# Patient Record
Sex: Female | Born: 1965
Health system: Southern US, Community
[De-identification: ages and names within clinical notes are randomized; demographics above are authoritative.]

## PROBLEM LIST (undated history)

## (undated) DIAGNOSIS — K219 Gastro-esophageal reflux disease without esophagitis: Secondary | ICD-10-CM

## (undated) DIAGNOSIS — T4145XA Adverse effect of unspecified anesthetic, initial encounter: Secondary | ICD-10-CM

## (undated) DIAGNOSIS — J45909 Unspecified asthma, uncomplicated: Secondary | ICD-10-CM

## (undated) DIAGNOSIS — T8859XA Other complications of anesthesia, initial encounter: Secondary | ICD-10-CM

## (undated) DIAGNOSIS — Z9889 Other specified postprocedural states: Secondary | ICD-10-CM

## (undated) DIAGNOSIS — R112 Nausea with vomiting, unspecified: Secondary | ICD-10-CM

## (undated) DIAGNOSIS — K8020 Calculus of gallbladder without cholecystitis without obstruction: Secondary | ICD-10-CM

## (undated) HISTORY — PX: EYE SURGERY: SHX253

## (undated) HISTORY — PX: BREAST BIOPSY: SHX20

## (undated) HISTORY — PX: KNEE SURGERY: SHX244

## (undated) HISTORY — PX: ABDOMINAL HYSTERECTOMY: SHX81

---

## 1997-12-02 ENCOUNTER — Other Ambulatory Visit: Admission: RE | Admit: 1997-12-02 | Discharge: 1997-12-02 | Payer: Self-pay | Admitting: Obstetrics and Gynecology

## 2001-08-01 ENCOUNTER — Ambulatory Visit (HOSPITAL_COMMUNITY): Admission: EM | Admit: 2001-08-01 | Discharge: 2001-08-01 | Payer: Self-pay | Admitting: Emergency Medicine

## 2001-08-03 ENCOUNTER — Inpatient Hospital Stay (HOSPITAL_COMMUNITY): Admission: RE | Admit: 2001-08-03 | Discharge: 2001-08-05 | Payer: Self-pay | Admitting: Specialist

## 2003-01-02 ENCOUNTER — Emergency Department (HOSPITAL_COMMUNITY): Admission: EM | Admit: 2003-01-02 | Discharge: 2003-01-03 | Payer: Self-pay

## 2007-05-15 ENCOUNTER — Encounter (INDEPENDENT_AMBULATORY_CARE_PROVIDER_SITE_OTHER): Payer: Self-pay | Admitting: Obstetrics and Gynecology

## 2007-05-15 ENCOUNTER — Ambulatory Visit (HOSPITAL_COMMUNITY): Admission: RE | Admit: 2007-05-15 | Discharge: 2007-05-16 | Payer: Self-pay | Admitting: Obstetrics and Gynecology

## 2010-11-23 ENCOUNTER — Other Ambulatory Visit: Payer: Self-pay | Admitting: Family Medicine

## 2010-11-23 DIAGNOSIS — R928 Other abnormal and inconclusive findings on diagnostic imaging of breast: Secondary | ICD-10-CM

## 2010-11-23 DIAGNOSIS — N6459 Other signs and symptoms in breast: Secondary | ICD-10-CM

## 2010-11-25 ENCOUNTER — Other Ambulatory Visit: Payer: Self-pay | Admitting: *Deleted

## 2010-11-25 DIAGNOSIS — N6459 Other signs and symptoms in breast: Secondary | ICD-10-CM

## 2010-11-30 ENCOUNTER — Ambulatory Visit
Admission: RE | Admit: 2010-11-30 | Discharge: 2010-11-30 | Disposition: A | Discharge status: Home / Self Care | Payer: BC Managed Care – PPO | Source: Ambulatory Visit | Attending: Family Medicine | Admitting: Family Medicine

## 2010-11-30 DIAGNOSIS — R928 Other abnormal and inconclusive findings on diagnostic imaging of breast: Secondary | ICD-10-CM

## 2010-12-20 NOTE — Discharge Summary (Signed)
Madeline Ramos, Madeline Ramos               ACCOUNT NO.:  [id number]   MEDICAL RECORD NO.:  [id number]          PATIENT TYPE:  OIB   LOCATION:  9307                          FACILITY:  [facility]   PHYSICIAN:  Sherron Monday, MD        DATE OF BIRTH:  13-Aug-1965   DATE OF ADMISSION:  05/15/2007  DATE OF DISCHARGE:  05/16/2007                               DISCHARGE SUMMARY   PROCEDURES:  Total vaginal hysterectomy, posterior repair, TVT SECUR,  cystoscopy.   ADMISSION DIAGNOSIS:  Pelvic relaxation, stress urinary incontinence.   HISTORY OF PRESENT ILLNESS:  Patient is a 45 year old G3, P1-0-2-1, with  complaints of pelvic relaxation and stress urinary incontinence, proven  by cystometric study.   PAST MEDICAL HISTORY:  Significant for asthma.   PAST SURGICAL HISTORY:  1. 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  2. In 1994, total knee replacement, left knee, with a bone graft.  3. In 2000, repeat total knee.  Dilatation and curettage.  4. Total knee revision, 2008.   ALLERGIES:  No known drug allergies.   MEDICATIONS:  Albuterol, Advair, Prilosec.   SOCIAL HISTORY:  Divorced.  Works in a Nurse, learning disability.  No history of  tobacco use.  Occasional alcohol use.  No other drug use.   PAST OB-GYN HISTORY:  G1 was a term vaginal delivery.  G2 was a  miscarriage with a D and C.  G3 was a therapeutic abortion.  No history  of any abnormal Pap smears.  No history of any sexually-transmitted  diseases.   PHYSICAL EXAM:  Per the dictated H and P.  Endometrial biopsy performed  and workup was within normal limits.  Cervical polyps were also removed.   Prior to admission, we discussed with the patient risks, benefits, and  alternatives to the surgical procedure.  She wished to proceed.  She  underwent the surgery on May 15, 2007, without difficulty and a 200  mL blood-loss.  Her postoperative course was uncomplicated.  She  remained afebrile with stable vital signs and was ambulating well.   Her  hemoglobin decreased from 10.3 to 7.9, but she was able to ambulate,  tolerate a diet and her pain was well-  controlled.  Her creatinine was stable and she was discharged to home  with routine discharge instructions and numbers to call if any questions  or problems, as well as prescriptions for Motrin and Vicodin.  She  voices understanding of all this and will call if she has any questions  or problems.      Sherron Monday, MD  Electronically Signed     JB/MEDQ  D:  05/16/2007  T:  05/17/2007  Job:  [id number]

## 2010-12-20 NOTE — Op Note (Signed)
NAMECHENITA, Madeline Ramos               ACCOUNT NO.:  [id number]   MEDICAL RECORD NO.:  [id number]          PATIENT TYPE:  AMB   LOCATION:  SDC                           [facility]ACILITY:  [facility]   PHYSICIAN:  Sherron Monday, MD        DATE OF BIRTH:  July 29, 1966   DATE OF PROCEDURE:  05/15/2007  DATE OF DISCHARGE:                               OPERATIVE REPORT   PREOPERATIVE DIAGNOSIS:  Pelvic relaxation, stress urinary incontinence.   POSTOPERATIVE DIAGNOSIS:  Pelvic relaxation, stress urinary  incontinence.   PROCEDURE:  Total vaginal hysterectomy, posterior repair, TVT suture,  cystoscopy.   SURGEON:  Sherron Monday, M.D.   ASSISTANT:  Zenaida Niece, M.D.   ANESTHESIA:  General with local.   FINDINGS:  184-gram uterus, normal ovaries bilaterally, 2+ rectocele,  stress urinary incontinence on cystometrics in the office.   SPECIMENS:  Uterus and cervix to pathology.   ESTIMATED BLOOD LOSS:  200 mL.   INTRAVENOUS FLUIDS:  2500 mL.   URINE OUTPUT:  I&O catheterization at the start of the procedure.   COMPLICATIONS:  None.   DISPOSITION:  Stable to PACU after the procedure.   PROCEDURE:  After informed consent including risks, benefits and  alternatives of the surgical procedure including but not limited to  bleeding, infection, damage to surrounding organs including but not  limited to bowel, bladder, ureters and blood vessels as well as urinary  retention, continued problems with incontinence and continued pelvic  pressure or pain, the patient was transported to the operating room,  placed on the table in supine position.  General anesthesia was induced  and found be adequate.  She was then placed in the yellow fin stirrups,  prepped and draped in the normal sterile fashion.  Her bladder was  sterilely drained using a heavy weighted speculum and a Sims retractor.  Her cervix was easily visualized and grasped with Christella Hartigan tenaculum.  It  was infiltrated with approximately 15 mL of  Pitressin, and then the  cervix was circumscribed with the Bovie cautery.  An attempt was made to  enter the peritoneal cavity anteriorly.  This was unable to be  accomplished, so entry posteriorly was performed.  A single stitch of 0  Vicryl was placed in the middle of this entry to control bleeding.  The  uterosacral ligaments were ligated on both sides.  The stitches were  held in a stepwise fashion.  The uterus and cervix were excised.  Pedicles were all found to be hemostatic.  The uterus and cervix were  removed, and the pedicles were again inspected and found to be  hemostatic.  There was bleeding noted at the left ovary.  This was made  hemostatic with a 3-0 Vicryl.  The uterosacral ligaments were plicated,  and the held sutures were tied together.  The anterior portion was  inspected, and minimal cystocele was noted.  The decision was made not  to perform the anterior repair.  The cuff was closed with 2-0 Vicryl in  a running locked fashion.  The posterior cul-de-sac was inspected.  The  rectocele was  illustrated, and the mucosa was infiltrated with Marcaine,  and using Allis clamps, the mucosa was incised and the fascia was  dissected out and closed in second layer of 2-0 Vicryl.  The mucosa was  then trimmed.  The mucosa was closed with 2-0 Vicryl in a running locked  fashion.  Attention was then turned to the TVT secure which was  performed in the typical fashion.  Approximately a centimeter and half  below the urethral meatus, a vertical incision was made after  infiltration with Marcaine and lidocaine, and the tract of the TVT  secure was dissected using Metzenbaum scissors.  A knife handle was used  to assure that there was enough room for the placement of the device.  The device was then placed.  The cystoscopy was performed revealing jets  from both ureters as well as the urethral opening with no problems or  interference by the device.  The placement of the mesh was  tested and  found to be snug but not overly tight.  The device was then deployed and  the introducers were removed.  This mucosa was closed with 2-0 Vicryl in  a running locked fashion.  Sponge, lap and needle counts were correct x2  at the end of the procedure.  The patient tolerated the procedure well.  Her Foley catheter was sterilely placed, and the vagina was packed with  Estrace soaked gauze.      Sherron Monday, MD  Electronically Signed     JB/MEDQ  D:  05/15/2007  T:  05/15/2007  Job:  [id number]

## 2010-12-20 NOTE — H&P (Signed)
Madeline Ramos, Madeline Ramos               ACCOUNT NO.:  [id number]   MEDICAL RECORD NO.:  [id number]          PATIENT TYPE:  AMB   LOCATION:  SDC                           [facility]   PHYSICIAN:  Sherron Monday, MD        DATE OF BIRTH:  [birth year]   DATE OF ADMISSION:  05/15/2007  DATE OF DISCHARGE:                              HISTORY & PHYSICAL   PLANNED PROCEDURES:  1. Total vagina hysterectomy.  2. Anterior and posterior repairs.  3. TVT SECUR.  4. Cystoscopy.   DATE OF PROCEDURE:  May 15, 2007.   ADMISSION DIAGNOSES:  1. Pelvic relaxation.  2. Stress urinary incontinence.   HISTORY OF PRESENT ILLNESS:  A 45 year old G3, P1-0-2-1, who presents  with complaints of prolapsing bladder and worsening symptoms and stress  urinary incontinence.   PAST MEDICAL HISTORY:  Significant for asthma.   PAST SURGICAL HISTORY:  Significant for:  1. 1989 giant cell tumor surgery in her femur.  2. 1994 total knee replacement, left knee.  3. 1994, total knee/ bone graft.  4. 2000, total knee.  5. D&C.  6. 2008, total knee revision.   ALLERGIES:  No known drug allergies.   MEDICATIONS:  Albuterol, Advair, Prilosec.   SOCIAL HISTORY:  She is divorced.  Works at a Scientist, product/process development.  No  history of tobacco use, rare alcohol use.  No other drug use.   GYN HISTORY:  She has no history of abnormal Pap smears.  Her last was  in August of this year.  At that time, she also had an endometrial  biopsy that was benign.  As well, a polyp was removed from her cervix  which was also benign.  She has no history of any sexually transmitted  diseases.  Menarche was at age 61 with regular cycles, and they last  approximately 4-6 days.  She has moderate flow and some pain.  She is  currently not sexually active.  In the past has been monogamous with  female partners.  No contraceptives.  On her first visit, she had  intramenstrual bleeding, resolved after the polypectomy, as well as  occasional post  coital bleeding.   OB HISTORY:  1. In April 1994, she had a term delivery of an 8 pound 1 ounce      infant.  2. In the 1980s, she had a miscarriage with a D&C.  3. Therapeutic abortion.   PHYSICAL EXAMINATION:  VITAL SIGNS:  Height 5 feet 5-1/2 inches.  Weight  195 pounds.  Blood pressure 140s/80s.  GENERAL:  No apparent distress.  HEENT:  Mucous membranes are moist.  NECK:  No lymphadenopathy.  Within normal limits.  HEART:  Regular rate and rhythm.  LUNGS:  Clear to auscultation bilaterally.  BACK:  Without costovertebral angle tenderness.  BREASTS:  C cup, no masses, nontender, no discharges.  ABDOMEN:  Soft, nontender, nondistended.  Good bowel sounds.  EXTREMITIES:  Symmetric and nontender.  PELVIC:  Normal external female genitalia.  Normal Bartholin, urethral,  Skene.  Poor support is noted.  Multiple polyps which were removed in  her ongoing GYN care.  Cervix has motion tenderness and normal vagina.  Uterus and adnexa within normal limits; however, she did have some  descensus.   She had endometrial biopsy and polypectomy for 3 cervical polyps.  The  endometrial biopsy was as above.   She had a urodynamic study revealing some stress urinary incontinence.   At this time, we discussed with the patient risks, benefits, and  alternatives to surgery including but not limited to infection, damage  to surrounding organs to include but not limited to bowel, bladder,  ureters, blood vessels as well as continued problems with voiding,  continued pressure, and or urinary leakage.  She voices understanding of  all of this and plans to proceed with the surgery on May 15, 2007.   ASSESSMENT AND PLAN:  A 45 year old female with pelvic relaxation total  vaginal hysterectomy, anterior and posterior repairs, and TVT SECUR.  Will proceed with surgery on May 15, 2007.      Sherron Monday, MD  Electronically Signed     JB/MEDQ  D:  05/14/2007  T:  05/14/2007  Job:  [id number]

## 2010-12-23 NOTE — Cardiovascular Report (Signed)
[place]  Patient:    Madeline Ramos, Madeline Ramos Visit Number: [id number] MRN: [id number]          Service Type: OUT Location: ED Attending Physician:  Lubertha South Dictated by:   Kerrin Champagne, M.D. Proc. Date: 08/02/01 Admit Date:  08/01/2001 Discharge Date: 08/01/2001                          Cardiac Catheterization  PREOPERATIVE DIAGNOSIS: Pyarthrosis, left total knee arthroplasty with distal left femoral allograft.  PROCEDURES: Left knee arthroscopic debridement and irrigation with 9 L of irrigant saline solution and 500 cc of double antibiotic solution, placement of drains, left knee.  SURGEON: Kerrin Champagne, M.D.  ASSISTANT: None.  ANESTHESIA: GOT, Dr. Randa Evens.  ESTIMATED BLOOD LOSS: 50 cc.  DRAINS: Two large Hemovac drains, left knee.  TOURNIQUET TIME: 300 mmHg 30 minutes.  BRIEF CLINICAL HISTORY: The patient is a 45 year old female. She has had a three-day history of progressive increasing left knee pain infusion. She was seen in the office yesterday, underwent aspiration of the knee, found to have yellow, cloudy, synovial fluid. White cell count was found to be 44.5 thousand with the differential of 85 neutrophils and 10 monocytes.  Sedimentation rate is mildly elevated at 51. Her white cell count on the CBC was 9.9 thousand with a normal shift. A Grams stain of synovial fluid showed no organism seen, few white cells. Culture reports are still not back and are pending. The patients synovial fluid glucose is 45. Her protein was 4.1. Initial discussion indicated rather borderline test. The patient was sent home last evening. Today with discussion with her total knee specialist, it was felt that irrigation and debridement was appropriate treatment, and this appears to be an early pyarthrosis of the left knee.  She is brought to the operating room today to undergo arthroscopic debridement and irrigation.  DESCRIPTION OF  PROCEDURE: After adequate general anesthesia, the left lower extremity was prepped with DuraPrep solution for the left ankle, left upper thigh.  A knee holder was used and tourniquet about the left upper thigh. Draped in the usual manner. The incisions were portals, two were made in the anterior medial parapatellar incision and used for a total knee arthroplasty to prevent later scar in this area. The superior medial portal used for placement of the inflow cannula expressing the synovial fluid and obtaining cultures both anaerobic and aerobic. The patient had received antibiotics prior to obtaining this culture, specifically vancomycin. Further, a stab incision was made over the anterolateral aspect of the knee, joint line, this for entry of a 5.5 shaver used to shave and debris the joint. Initially, after obtaining cultures the knee was irrigated with normal saline solution. Using the inflow cannula superior medially. A stab incision inferomedially was used to place an inflow cannula, which was used for exit or egress of the irrigate solution from the knee joint, and the knee was irrigated over 6 liters of fluid. After the irrigation showed clearance and normal clear fluid, then the arthroscope was then placed into the anterior medial portal and examination of the knee demonstrates some small areas of friable scar tissue as such that a 5.5 Kuda shaver was placed in the anterolateral portal and used to shave these small areas of friable tissue that were present. No tumor was felt to be present. This patient has had a previous history of giant cell tumor. There was no obvious vascular abnormalities  present. With this and after completion of irrigation of a further 3 liters, double antibiotic solution was irrigated through the left knee allowing the last to remain in place for several minutes. The knee was then drained and then large Hemovac drains were then placed, one through the superior  medial portal, one through the inferolateral portal to drain the knee joint on both sides. These were sewn into place using 4-0 nylon suture and the single inferomedial portal was then closed with a single vertical mattress suture of 4-0 nylon. Adaptic, 4 x 4s, ABD pads then fixed to the skin with Kerlix. Tourniquet released and then Ace wrap applied from the left foot to the left upper thigh. The patient was then reactivated, returned to the recovery room in satisfactory condition. Surgical specimen cultures, anaerobic and aerobic sent for culture and sensitivity. The patient has had previous cultures obtained on an outpatient basis on August 01, 2001. Dictated by:   Kerrin Champagne, M.D. Attending Physician:  Lubertha South DD:  08/02/01 TD:  08/03/01 Job: [id number]

## 2011-05-18 LAB — CBC
HCT: 23.2 — ABNORMAL LOW
Hemoglobin: 7.9 — CL
MCHC: 33.8
MCV: 81.1
Platelets: 237
Platelets: 318
RBC: 2.83 — ABNORMAL LOW
RBC: 3.78 — ABNORMAL LOW
RDW: 15.4 — ABNORMAL HIGH
WBC: 10.7 — ABNORMAL HIGH

## 2011-05-18 LAB — BASIC METABOLIC PANEL
BUN: 5 — ABNORMAL LOW
BUN: 8
CO2: 27
Calcium: 8.9
Chloride: 106
Creatinine, Ser: 0.9
GFR calc Af Amer: >60
GFR calc non Af Amer: >60
Glucose, Bld: 89
Potassium: 3.9
Sodium: 138

## 2011-05-18 LAB — HCG, SERUM, QUALITATIVE: Preg, Serum: NEGATIVE

## 2011-05-18 LAB — HEMATOCRIT: HCT: 25 — ABNORMAL LOW

## 2011-05-18 LAB — HEMOGLOBIN: Hemoglobin: 8.4 — ABNORMAL LOW

## 2015-12-19 ENCOUNTER — Emergency Department (HOSPITAL_BASED_OUTPATIENT_CLINIC_OR_DEPARTMENT_OTHER): Payer: BLUE CROSS/BLUE SHIELD

## 2015-12-19 ENCOUNTER — Encounter (HOSPITAL_COMMUNITY): Payer: Self-pay | Admitting: *Deleted

## 2015-12-19 ENCOUNTER — Emergency Department (HOSPITAL_COMMUNITY)
Admission: EM | Admit: 2015-12-19 | Discharge: 2015-12-19 | Disposition: A | Discharge status: Home / Self Care | Payer: BLUE CROSS/BLUE SHIELD | Attending: Emergency Medicine | Admitting: Emergency Medicine

## 2015-12-19 ENCOUNTER — Emergency Department (HOSPITAL_COMMUNITY): Payer: BLUE CROSS/BLUE SHIELD

## 2015-12-19 DIAGNOSIS — M79605 Pain in left leg: Secondary | ICD-10-CM | POA: Diagnosis present

## 2015-12-19 DIAGNOSIS — Z96652 Presence of left artificial knee joint: Secondary | ICD-10-CM

## 2015-12-19 DIAGNOSIS — Z91040 Latex allergy status: Secondary | ICD-10-CM | POA: Insufficient documentation

## 2015-12-19 DIAGNOSIS — M25462 Effusion, left knee: Secondary | ICD-10-CM | POA: Insufficient documentation

## 2015-12-19 DIAGNOSIS — M2540 Effusion, unspecified joint: Secondary | ICD-10-CM

## 2015-12-19 DIAGNOSIS — M79609 Pain in unspecified limb: Secondary | ICD-10-CM | POA: Diagnosis not present

## 2015-12-19 DIAGNOSIS — M25562 Pain in left knee: Secondary | ICD-10-CM

## 2015-12-19 MED ORDER — TRAMADOL HCL 50 MG PO TABS: 50.0000 mg | ORAL_TABLET | Freq: Four times a day (QID) | ORAL | Status: DC | PRN

## 2015-12-19 MED ORDER — ACETAMINOPHEN 325 MG PO TABS
650.0000 mg | ORAL_TABLET | Freq: Once | ORAL | Status: AC
  Administered 2015-12-19: 650 mg via ORAL
  Filled 2015-12-19: qty 2

## 2015-12-19 NOTE — ED Provider Notes (Signed)
CSN: [id number]     Arrival date & time 12/19/15  0940 History   First MD Initiated Contact with Patient 12/19/15 1328     Chief Complaint  Patient presents with  . Leg Pain     (Consider location/radiation/quality/duration/timing/severity/associated sxs/prior Treatment) HPI Madeline Ramos is a 50 y.o. female with PMH significant for multiple left knee surgeries (6 total) who presents with 1 week of gradual onset, constant, worsening left thigh pain. No trauma or injury. Patient describes the pain as a burning and sharp pain. She also states that it sometimes feels tight. She states that the pain starts around her left knee and shoots up her left lateral thigh and left medial thigh. No color changes, weakness, sensory disturbance, fever, chills. No history of DVT. No active malignancy. No recent immobilization.  History reviewed. No pertinent past medical history. Past Surgical History  Procedure Laterality Date  . Knee surgery Left   . Abdominal hysterectomy     No family history on file. Social History  Substance Use Topics  . Smoking status: Never Smoker   . Smokeless tobacco: None  . Alcohol Use: No   OB History    No data available     Review of Systems    Allergies  Codeine and Latex  Home Medications   Prior to Admission medications   Not on File   BP 119/73 mmHg  Pulse 64  Temp(Src) 98.3 F (36.8 C) (Oral)  Resp 18  Ht  (1.651 m)  Wt 94.348 kg  BMI 34.61 kg/m2  SpO2 98% Physical Exam  Constitutional: She is oriented to person, place, and time. She appears well-developed and well-nourished.  Non-toxic appearance. She does not have a sickly appearance. She does not appear ill.  HENT:  Head: Normocephalic and atraumatic.  Mouth/Throat: Oropharynx is clear and moist.  Eyes: Conjunctivae are normal.  Neck: Normal range of motion. Neck supple.  Cardiovascular: Normal rate and regular rhythm.   Pulses:      Dorsalis pedis pulses are 2+ on the right  side, and 2+ on the left side.  Pulmonary/Chest: Effort normal and breath sounds normal. No accessory muscle usage or stridor. No respiratory distress. She has no wheezes. She has no rhonchi. She has no rales.  Abdominal: Soft. Bowel sounds are normal. She exhibits no distension. There is no tenderness.  Musculoskeletal: Normal range of motion. She exhibits tenderness.  Left knee swollen (chronic) secondary to multiple surgeries.  No warmth, erythema, or ecchymosis.  FAROM without pain.  No tenderness. Positive ballottement test. Left thigh swollen (chronic) without erythema.  Pain with left hip flexion and extension.  Left lateral hip TTP.  No inguinal tenderness. No lumbar or sacral midline tenderness.  No tenderness along sciatic notch.  Lymphadenopathy:    She has no cervical adenopathy.  Neurological: She is alert and oriented to person, place, and time.  Strength and sensation intact to light touch throughout lower extremities bilaterally.  Decreased strength of left hip secondary to pain.  Antalgic gait.   Skin: Skin is warm and dry.  No erythema, palpable cords, or pitting edema.  Psychiatric: She has a normal mood and affect. Her behavior is normal.    ED Course  Procedures (including critical care time) Labs Review Labs Reviewed - No data to display  Imaging Review Dg Knee Complete 4 Views Left  12/19/2015  CLINICAL DATA:  Anterior and lateral hip pain. Left knee pain for 1 week. EXAM: LEFT KNEE - COMPLETE 4+ VIEW  COMPARISON:  None. FINDINGS: There is a total left knee arthroplasty. There is a anterior and lateral metallic sideplate transfixing the left femoral diaphysis. There is osteolysis along the distal medial femoral diaphysis and metaphysis deep to the medial side plate with a large joint effusion concerning for infection. There is no acute fracture or dislocation. IMPRESSION: 1. There is osteolysis along the distal medial femoral diaphysis and metaphysis deep to the medial  side plate with a large joint effusion concerning for infection. Recommend arthrocentesis for further evaluation. Electronically Signed   By: Elige KoHetal  Patel   On: 12/19/2015 15:24   Dg Hip Unilat With Pelvis 2-3 Views Left  12/19/2015  CLINICAL DATA:  Anterior and lateral left hip pain and swelling. No known injury. EXAM: DG HIP (WITH OR WITHOUT PELVIS) 2-3V LEFT COMPARISON:  None. FINDINGS: Bones of the pelvis appear normal. Both hips appear normal and symmetric. No degenerative change. No evidence of avascular necrosis. No acute finding or relative to the distal femoral hardware. IMPRESSION: Negative. Electronically Signed   By: Paulina FusiMark  Shogry M.D.   On: 12/19/2015 15:26   I have personally reviewed and evaluated these images and lab results as part of my medical decision-making.   EKG Interpretation None      MDM   Final diagnoses:  History of total left knee replacement  Joint effusion  Left knee pain   Patient presents with left upper lower extremity pain. History of multiple left knee surgeries. VSS, NAD. Neurovascularly intact. Left upper leg is swollen, however, patient states that this is chronic. No overlying erythema, palpable cords, or pitting edema. Full active range of motion of left knee without pain. No lumbar midline tenderness. No tenderness over sciatic notch. She is tender to left lateral hip. She has decreased range of motion in left hip secondary to pain. DDX includes tendinitis, DVT, bursitis, bony abnormality.  Low suspicion for septic arthritis, sciatica, cellulitis, thrombophlebitis, PVD or arterial occlusion. Plan to get LLE doppler and plain films of left knee and left hip. Lower extremity Doppler negative for DVT. Plain films of the hip, negative.  Plain films of the knee show osteolysis along the distal medial femoral diaphysis and metaphysis deep to the medial side plate with a large joint effusion concerning for infection.  Recommend arthrocentesis for further  evaluation.  Patient will be seen by Dr. Estell HarpinZammit. [facility] left [facility] shows similar results.  I suspect this is an exacerbation of chronic knee pain.  Findings not consistent with septic joint, patient afebrile, well-appearing, FAROM without pain, minimal erythema if any, left knee swelling is chronic.  No indication for arthrocentesis at this time.  Plan to discharge home with Tramadol.  Follow up orthopedics.  Discussed return precautions.  Patient agrees and acknowledges the above plan for discharge.       Cheri FowlerKayla Fe Okubo, PA-C 12/19/15 [phone]  Bethann BerkshireJoseph Zammit, MD 12/19/15 [phone]

## 2015-12-19 NOTE — ED Notes (Signed)
Pt c/o L thigh pain since Tuesday.  Hx of multiple surgeries to L knee.  Denies injury.

## 2015-12-19 NOTE — Progress Notes (Signed)
*  Preliminary Results* Left lower extremity venous duplex completed. Left lower extremity is negative for deep vein thrombosis. There is no evidence of left Baker's cyst.  12/19/2015 2:30 PM  Gertie FeyMichelle Suraiya Dickerson, RVT, RDCS, RDMS

## 2015-12-19 NOTE — Discharge Instructions (Signed)
°  Your work up today was negative for DVT or any acute changes.  This is likely an exacerbation of your chronic knee pain.  Take Tramadol as needed for pain.  Please contact Dr. Magnus IvanBlackman with orthopedics for further evaluation and management.  Knee Pain Knee pain is a very common symptom and can have many causes. Knee pain often goes away when you follow your health care provider's instructions for relieving pain and discomfort at home. However, knee pain can develop into a condition that needs treatment. Some conditions may include:  Arthritis caused by wear and tear (osteoarthritis).  Arthritis caused by swelling and irritation (rheumatoid arthritis or gout).  A cyst or growth in your knee.  An infection in your knee joint.  An injury that will not heal.  Damage, swelling, or irritation of the tissues that support your knee (torn ligaments or tendinitis). If your knee pain continues, additional tests may be ordered to diagnose your condition. Tests may include X-rays or other imaging studies of your knee. You may also need to have fluid removed from your knee. Treatment for ongoing knee pain depends on the cause, but treatment may include:  Medicines to relieve pain or swelling.  Steroid injections in your knee.  Physical therapy.  Surgery. HOME CARE INSTRUCTIONS  Take medicines only as directed by your health care provider.  Rest your knee and keep it raised (elevated) while you are resting.  Do not do things that cause or worsen pain.  Avoid high-impact activities or exercises, such as running, jumping rope, or doing jumping jacks.  Apply ice to the knee area:  Put ice in a plastic bag.  Place a towel between your skin and the bag.  Leave the ice on for 20 minutes, 2-3 times a day.  Ask your health care provider if you should wear an elastic knee support.  Keep a pillow under your knee when you sleep.  Lose weight if you are overweight. Extra weight can put pressure  on your knee.  Do not use any tobacco products, including cigarettes, chewing tobacco, or electronic cigarettes. If you need help quitting, ask your health care provider. Smoking may slow the healing of any bone and joint problems that you may have. SEEK MEDICAL CARE IF:  Your knee pain continues, changes, or gets worse.  You have a fever along with knee pain.  Your knee buckles or locks up.  Your knee becomes more swollen. SEEK IMMEDIATE MEDICAL CARE IF:   Your knee joint feels hot to the touch.  You have chest pain or trouble breathing.   This information is not intended to replace advice given to you by your health care provider. Make sure you discuss any questions you have with your health care provider.   Document Released: 05/21/2007 Document Revised: 08/14/2014 Document Reviewed: 03/09/2014 Elsevier Interactive Patient Education [employer]2016 Elsevier Inc.

## 2016-04-17 DIAGNOSIS — J45998 Other asthma: Secondary | ICD-10-CM | POA: Diagnosis not present

## 2016-04-17 DIAGNOSIS — R0602 Shortness of breath: Secondary | ICD-10-CM | POA: Diagnosis not present

## 2016-08-18 DIAGNOSIS — M25462 Effusion, left knee: Secondary | ICD-10-CM | POA: Diagnosis not present

## 2016-08-18 DIAGNOSIS — M7062 Trochanteric bursitis, left hip: Secondary | ICD-10-CM | POA: Diagnosis not present

## 2016-08-18 DIAGNOSIS — Z96652 Presence of left artificial knee joint: Secondary | ICD-10-CM | POA: Diagnosis not present

## 2016-08-18 DIAGNOSIS — T8484XD Pain due to internal orthopedic prosthetic devices, implants and grafts, subsequent encounter: Secondary | ICD-10-CM | POA: Diagnosis not present

## 2016-08-18 DIAGNOSIS — Z471 Aftercare following joint replacement surgery: Secondary | ICD-10-CM | POA: Diagnosis not present

## 2016-08-18 DIAGNOSIS — Z8603 Personal history of neoplasm of uncertain behavior: Secondary | ICD-10-CM | POA: Diagnosis not present

## 2016-11-01 ENCOUNTER — Other Ambulatory Visit: Payer: Self-pay | Admitting: Nurse Practitioner

## 2016-11-01 DIAGNOSIS — J4520 Mild intermittent asthma, uncomplicated: Secondary | ICD-10-CM | POA: Diagnosis not present

## 2016-11-01 DIAGNOSIS — Z1231 Encounter for screening mammogram for malignant neoplasm of breast: Secondary | ICD-10-CM

## 2016-11-15 DIAGNOSIS — Z0000 Encounter for general adult medical examination without abnormal findings: Secondary | ICD-10-CM | POA: Diagnosis not present

## 2016-11-22 ENCOUNTER — Ambulatory Visit
Admission: RE | Admit: 2016-11-22 | Discharge: 2016-11-22 | Disposition: A | Discharge status: Home / Self Care | Payer: BLUE CROSS/BLUE SHIELD | Source: Ambulatory Visit | Attending: Nurse Practitioner | Admitting: Nurse Practitioner

## 2016-11-22 DIAGNOSIS — Z1231 Encounter for screening mammogram for malignant neoplasm of breast: Secondary | ICD-10-CM

## 2016-12-01 DIAGNOSIS — E785 Hyperlipidemia, unspecified: Secondary | ICD-10-CM | POA: Diagnosis not present

## 2017-02-28 DIAGNOSIS — W57XXXA Bitten or stung by nonvenomous insect and other nonvenomous arthropods, initial encounter: Secondary | ICD-10-CM | POA: Diagnosis not present

## 2017-02-28 DIAGNOSIS — L03119 Cellulitis of unspecified part of limb: Secondary | ICD-10-CM | POA: Diagnosis not present

## 2017-02-28 DIAGNOSIS — S60861A Insect bite (nonvenomous) of right wrist, initial encounter: Secondary | ICD-10-CM | POA: Diagnosis not present

## 2017-02-28 DIAGNOSIS — S60862A Insect bite (nonvenomous) of left wrist, initial encounter: Secondary | ICD-10-CM | POA: Diagnosis not present

## 2017-06-20 DIAGNOSIS — D499 Neoplasm of unspecified behavior of unspecified site: Secondary | ICD-10-CM | POA: Diagnosis not present

## 2017-06-20 DIAGNOSIS — R109 Unspecified abdominal pain: Secondary | ICD-10-CM | POA: Diagnosis not present

## 2017-06-20 DIAGNOSIS — K219 Gastro-esophageal reflux disease without esophagitis: Secondary | ICD-10-CM | POA: Diagnosis not present

## 2017-06-20 DIAGNOSIS — Z1389 Encounter for screening for other disorder: Secondary | ICD-10-CM | POA: Diagnosis not present

## 2017-06-20 DIAGNOSIS — Z8719 Personal history of other diseases of the digestive system: Secondary | ICD-10-CM | POA: Diagnosis not present

## 2017-06-22 ENCOUNTER — Other Ambulatory Visit: Payer: Self-pay | Admitting: Internal Medicine

## 2017-06-22 DIAGNOSIS — Z8719 Personal history of other diseases of the digestive system: Secondary | ICD-10-CM

## 2017-06-22 DIAGNOSIS — R109 Unspecified abdominal pain: Secondary | ICD-10-CM

## 2017-07-02 ENCOUNTER — Other Ambulatory Visit: Payer: BLUE CROSS/BLUE SHIELD

## 2017-07-04 ENCOUNTER — Ambulatory Visit
Admission: RE | Admit: 2017-07-04 | Discharge: 2017-07-04 | Disposition: A | Discharge status: Home / Self Care | Payer: BLUE CROSS/BLUE SHIELD | Source: Ambulatory Visit | Attending: Internal Medicine | Admitting: Internal Medicine

## 2017-07-04 DIAGNOSIS — Z8719 Personal history of other diseases of the digestive system: Secondary | ICD-10-CM

## 2017-07-04 DIAGNOSIS — K8020 Calculus of gallbladder without cholecystitis without obstruction: Secondary | ICD-10-CM | POA: Diagnosis not present

## 2017-07-04 DIAGNOSIS — R109 Unspecified abdominal pain: Secondary | ICD-10-CM

## 2017-07-17 ENCOUNTER — Other Ambulatory Visit: Payer: Self-pay | Admitting: General Surgery

## 2017-07-17 DIAGNOSIS — J45909 Unspecified asthma, uncomplicated: Secondary | ICD-10-CM | POA: Diagnosis not present

## 2017-07-17 DIAGNOSIS — Z6834 Body mass index (BMI) 34.0-34.9, adult: Secondary | ICD-10-CM | POA: Diagnosis not present

## 2017-07-17 DIAGNOSIS — K219 Gastro-esophageal reflux disease without esophagitis: Secondary | ICD-10-CM | POA: Diagnosis not present

## 2017-07-17 DIAGNOSIS — K8020 Calculus of gallbladder without cholecystitis without obstruction: Secondary | ICD-10-CM | POA: Diagnosis not present

## 2017-08-03 ENCOUNTER — Encounter (HOSPITAL_BASED_OUTPATIENT_CLINIC_OR_DEPARTMENT_OTHER): Payer: Self-pay | Admitting: *Deleted

## 2017-08-03 ENCOUNTER — Other Ambulatory Visit: Payer: Self-pay

## 2017-08-05 NOTE — H&P (Signed)
Madeline Ramos [facility] Patient #: [id number] DOB: 02/06/1966 Divorced / Language: [place] / Race: White Female       History of Present Illness     . This is a very pleasant, 51 year old female, referred by Dr. Geoffry Paradiseichard Aronson for evaluation of symptomatic gallstones. Her son is with her throughout the encounter      She has been having gallbladder attacks for many years but they're becoming worse now becoming more severe in intensity and more frequent. She states that she is having almost daily attacks. She describes postprandial severe epigastric pain and nausea radiating to her back. Cheese and other fatty foods are triggers for this. She feels bloated. It will last anywhere from 20 minutes to 2 hours and will then resolve. She has chronic alternating diarrhea and constipation, not necessarily exacerbated by abdominal pain attacks. On June 30, 2017 she had an ultrasound which shows multiple stones in the gallbladder up to 1.8 cm but no inflammatory changes. CBD 4.4 mm. She has not had any blood work. She is fearful of complications. She is asymptomatic today.       Comorbidities include controlled asthma on inhalers. GERD on Protonix. Remote vaginal hysterectomy. Knee surgery. Cataract surgery. Basically pretty healthy. Family history reveals mother and father living without major medical problems Social history reveals she is single. One child who is with her today. Denies alcohol or tobacco. 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       Because of accelerating symptoms she would like to go ahead with surgery. She has fairly typical biliary colic and it is highly likely that the gallbladder is the source of her symptoms. I will not think any further workup is necessary other than blood work      She'll be scheduled for laparoscopic cholecystectomy with cholangiogram, possible open cholecystectomy. I discussed  the indications, details, techniques, and risks of his surgery with her in detail. She is aware of the risk of bleeding, infection, conversion to open laparotomy, port site hernia, postop bile leak requiring intervention, admission to hospital if she has common bile duct stones. Injury to adjacent organs such as the main bile duct or intestine with major reconstructive surgery. Postop diarrhea or pancreatitis. She understands all these issues well. All of her questions are answered. She agrees with this plan. I have requested that she be scheduled urgently because of her daily symptoms. I told her to continue to take her Protonix. I told her to adhere to a strict low-fat diet in the meantime   Allergies  Codeine Phosphate *ANALGESICS - OPIOID*   Medication History  Pantoprazole Sodium (40MG  Tablet DR, Oral) Active. Symbicort (160-4.5MCG/ACT Aerosol, Inhalation) Active.  Vitals  Weight: 206.31 lb Height: 65in Body Surface Area: 2 m Body Mass Index: 34.33 kg/m  Temp.: 98.26F  Pulse: 68 (Regular)  Resp.: 18 (Unlabored)  P.OX: 97% (Room air) BP: 122/82 (Sitting, Left Arm, Standard)    Physical Exam  General Mental Status-Alert. General Appearance-Consistent with stated age. Hydration-Well hydrated. Voice-Normal.  Head and Neck Head-normocephalic, atraumatic with no lesions or palpable masses. Trachea-midline. Thyroid Gland Characteristics - normal size and consistency.  Eye Eyeball - Bilateral-Extraocular movements intact. Sclera/Conjunctiva - Bilateral-No scleral icterus.  Chest and Lung Exam Chest and lung exam reveals -quiet, even and easy respiratory effort with no use of accessory muscles and on auscultation, normal breath sounds, no adventitious sounds and normal vocal resonance. Inspection Chest Wall - Normal. Back -  normal.  Cardiovascular Cardiovascular examination reveals -normal heart sounds, regular rate and rhythm with  no murmurs and normal pedal pulses bilaterally.  Abdomen Inspection Inspection of the abdomen reveals - No Hernias. Skin - Scar - no surgical scars. Palpation/Percussion Palpation and Percussion of the abdomen reveal - Soft, Non Tender, No Rebound tenderness, No Rigidity (guarding) and No hepatosplenomegaly. Auscultation Auscultation of the abdomen reveals - Bowel sounds normal. Note: Benign abdominal exam today. No mass tenderness or hernia.   Neurologic Neurologic evaluation reveals -alert and oriented x 3 with no impairment of recent or remote memory. Mental Status-Normal.  Musculoskeletal Normal Exam - Left-Upper Extremity Strength Normal and Lower Extremity Strength Normal. Normal Exam - Right-Upper Extremity Strength Normal and Lower Extremity Strength Normal.  Lymphatic Head & Neck  General Head & Neck Lymphatics: Bilateral - Description - Normal. Axillary  General Axillary Region: Bilateral - Description - Normal. Tenderness - Non Tender. Femoral & Inguinal  Generalized Femoral & Inguinal Lymphatics: Bilateral - Description - Normal. Tenderness - Non Tender.    Assessment & Plan  GALLSTONES (K80.20)   Your symptoms of upper abdominal pain, back pain, and nausea after meals are very typical for gallbladder attacks Ultrasound shows numerous gallstones but no other complications YOUr symptoms are becoming more frequent and more severe Eventually you will have a complication  We have discussed this in detail with you and your son you state that you would like to proceed with surgery in the near future  you will be scheduled for laparoscopic cholecystectomy with cholangiogram in the near future We have discussed indications, techniques, and risks of the surgery in detail Please review the information booklet   ASTHMA, CHRONIC (J45.909) CHRONIC GERD (K21.9) BMI 34.0-34.9,ADULT (Z68.34)    Angelia MouldHaywood M. Derrell LollingIngram, M.D., [facility] LowFACS [facility] General and Minimally invasive Surgery Breast and Colorectal Surgery Office:   [phone] Pager:   [phone]

## 2017-08-08 ENCOUNTER — Encounter (HOSPITAL_BASED_OUTPATIENT_CLINIC_OR_DEPARTMENT_OTHER)
Admission: RE | Admit: 2017-08-08 | Discharge: 2017-08-08 | Disposition: A | Discharge status: Home / Self Care | Payer: BLUE CROSS/BLUE SHIELD | Source: Ambulatory Visit | Attending: General Surgery | Admitting: General Surgery

## 2017-08-08 DIAGNOSIS — Z885 Allergy status to narcotic agent status: Secondary | ICD-10-CM | POA: Diagnosis not present

## 2017-08-08 DIAGNOSIS — Z6833 Body mass index (BMI) 33.0-33.9, adult: Secondary | ICD-10-CM | POA: Diagnosis not present

## 2017-08-08 DIAGNOSIS — E669 Obesity, unspecified: Secondary | ICD-10-CM | POA: Diagnosis not present

## 2017-08-08 DIAGNOSIS — Z91040 Latex allergy status: Secondary | ICD-10-CM | POA: Diagnosis not present

## 2017-08-08 DIAGNOSIS — Z79899 Other long term (current) drug therapy: Secondary | ICD-10-CM | POA: Diagnosis not present

## 2017-08-08 DIAGNOSIS — K8010 Calculus of gallbladder with chronic cholecystitis without obstruction: Secondary | ICD-10-CM | POA: Diagnosis not present

## 2017-08-08 DIAGNOSIS — J45909 Unspecified asthma, uncomplicated: Secondary | ICD-10-CM | POA: Diagnosis not present

## 2017-08-08 DIAGNOSIS — K219 Gastro-esophageal reflux disease without esophagitis: Secondary | ICD-10-CM | POA: Diagnosis not present

## 2017-08-08 NOTE — Progress Notes (Signed)
Ensure pre surgery drink given with instructions to complete by 0530 dos, pt verbalized understanding. 

## 2017-08-09 LAB — CBC
HCT: 33.2 % — ABNORMAL LOW (ref 36.0–46.0)
Hemoglobin: 10.1 g/dL — ABNORMAL LOW (ref 12.0–15.0)
MCH: 27.1 pg (ref 26.0–34.0)
MCHC: 30.4 g/dL (ref 30.0–36.0)
MCV: 89 fL (ref 78.0–100.0)
PLATELETS: 240 10*3/uL (ref 150–400)
RBC: 3.73 MIL/uL — AB (ref 3.87–5.11)
RDW: 15.2 % (ref 11.5–15.5)
WBC: 14 10*3/uL — AB (ref 4.0–10.5)

## 2017-08-09 LAB — COMPREHENSIVE METABOLIC PANEL
ALBUMIN: 3.5 g/dL (ref 3.5–5.0)
ALK PHOS: 70 U/L (ref 38–126)
ALT: 21 U/L (ref 14–54)
AST: 26 U/L (ref 15–41)
Anion gap: 7 (ref 5–15)
BUN: 15 mg/dL (ref 6–20)
CALCIUM: 9.2 mg/dL (ref 8.9–10.3)
CHLORIDE: 102 mmol/L (ref 101–111)
CO2: 27 mmol/L (ref 22–32)
CREATININE: 0.83 mg/dL (ref 0.44–1.00)
GFR calc non Af Amer: >60 mL/min (ref >60)
GLUCOSE: 70 mg/dL (ref 65–99)
Potassium: 4.1 mmol/L (ref 3.5–5.1)
SODIUM: 136 mmol/L (ref 135–145)
Total Bilirubin: 0.3 mg/dL (ref 0.3–1.2)
Total Protein: 7.2 g/dL (ref 6.5–8.1)

## 2017-08-10 ENCOUNTER — Ambulatory Visit (HOSPITAL_COMMUNITY): Payer: BLUE CROSS/BLUE SHIELD

## 2017-08-10 ENCOUNTER — Encounter (HOSPITAL_BASED_OUTPATIENT_CLINIC_OR_DEPARTMENT_OTHER): Payer: Self-pay | Admitting: Anesthesiology

## 2017-08-10 ENCOUNTER — Ambulatory Visit (HOSPITAL_BASED_OUTPATIENT_CLINIC_OR_DEPARTMENT_OTHER): Payer: BLUE CROSS/BLUE SHIELD | Admitting: Anesthesiology

## 2017-08-10 ENCOUNTER — Ambulatory Visit (HOSPITAL_BASED_OUTPATIENT_CLINIC_OR_DEPARTMENT_OTHER)
Admission: RE | Admit: 2017-08-10 | Discharge: 2017-08-10 | Disposition: A | Discharge status: Home / Self Care | Payer: BLUE CROSS/BLUE SHIELD | Source: Ambulatory Visit | Attending: General Surgery | Admitting: General Surgery

## 2017-08-10 ENCOUNTER — Encounter (HOSPITAL_BASED_OUTPATIENT_CLINIC_OR_DEPARTMENT_OTHER)
Admission: RE | Disposition: A | Discharge status: Home / Self Care | Payer: Self-pay | Source: Ambulatory Visit | Attending: General Surgery

## 2017-08-10 ENCOUNTER — Other Ambulatory Visit: Payer: Self-pay

## 2017-08-10 DIAGNOSIS — K8020 Calculus of gallbladder without cholecystitis without obstruction: Secondary | ICD-10-CM | POA: Diagnosis not present

## 2017-08-10 DIAGNOSIS — Z91040 Latex allergy status: Secondary | ICD-10-CM | POA: Insufficient documentation

## 2017-08-10 DIAGNOSIS — K8010 Calculus of gallbladder with chronic cholecystitis without obstruction: Secondary | ICD-10-CM | POA: Diagnosis not present

## 2017-08-10 DIAGNOSIS — E669 Obesity, unspecified: Secondary | ICD-10-CM | POA: Diagnosis not present

## 2017-08-10 DIAGNOSIS — Z419 Encounter for procedure for purposes other than remedying health state, unspecified: Secondary | ICD-10-CM

## 2017-08-10 DIAGNOSIS — J45909 Unspecified asthma, uncomplicated: Secondary | ICD-10-CM | POA: Diagnosis not present

## 2017-08-10 DIAGNOSIS — K219 Gastro-esophageal reflux disease without esophagitis: Secondary | ICD-10-CM | POA: Diagnosis not present

## 2017-08-10 DIAGNOSIS — Z885 Allergy status to narcotic agent status: Secondary | ICD-10-CM | POA: Diagnosis not present

## 2017-08-10 DIAGNOSIS — Z79899 Other long term (current) drug therapy: Secondary | ICD-10-CM | POA: Insufficient documentation

## 2017-08-10 DIAGNOSIS — Z6833 Body mass index (BMI) 33.0-33.9, adult: Secondary | ICD-10-CM | POA: Diagnosis not present

## 2017-08-10 HISTORY — PX: CHOLECYSTECTOMY: SHX55

## 2017-08-10 HISTORY — DX: Gastro-esophageal reflux disease without esophagitis: K21.9

## 2017-08-10 HISTORY — DX: Calculus of gallbladder without cholecystitis without obstruction: K80.20

## 2017-08-10 HISTORY — DX: Other specified postprocedural states: Z98.890

## 2017-08-10 HISTORY — DX: Adverse effect of unspecified anesthetic, initial encounter: T41.45XA

## 2017-08-10 HISTORY — DX: Other complications of anesthesia, initial encounter: T88.59XA

## 2017-08-10 HISTORY — DX: Unspecified asthma, uncomplicated: J45.909

## 2017-08-10 HISTORY — DX: Other specified postprocedural states: R11.2

## 2017-08-10 SURGERY — LAPAROSCOPIC CHOLECYSTECTOMY WITH INTRAOPERATIVE CHOLANGIOGRAM
Anesthesia: General | Site: Abdomen

## 2017-08-10 MED ORDER — PHENYLEPHRINE 40 MCG/ML (10ML) SYRINGE FOR IV PUSH (FOR BLOOD PRESSURE SUPPORT)
PREFILLED_SYRINGE | INTRAVENOUS | Status: AC
  Filled 2017-08-10: qty 10

## 2017-08-10 MED ORDER — EPHEDRINE 5 MG/ML INJ
INTRAVENOUS | Status: AC
  Filled 2017-08-10: qty 10

## 2017-08-10 MED ORDER — ACETAMINOPHEN 500 MG PO TABS
ORAL_TABLET | ORAL | Status: AC
  Filled 2017-08-10: qty 2

## 2017-08-10 MED ORDER — OXYCODONE HCL 5 MG PO TABS: 5.0000 mg | ORAL_TABLET | ORAL | Status: DC | PRN

## 2017-08-10 MED ORDER — PROMETHAZINE HCL 25 MG/ML IJ SOLN
6.2500 mg | INTRAMUSCULAR | Status: AC | PRN
  Administered 2017-08-10 (×2): 6.25 mg via INTRAVENOUS

## 2017-08-10 MED ORDER — CHLORHEXIDINE GLUCONATE CLOTH 2 % EX PADS: 6.0000 | MEDICATED_PAD | Freq: Once | CUTANEOUS | Status: DC

## 2017-08-10 MED ORDER — SUGAMMADEX SODIUM 200 MG/2ML IV SOLN
INTRAVENOUS | Status: AC
  Filled 2017-08-10: qty 2

## 2017-08-10 MED ORDER — SCOPOLAMINE 1 MG/3DAYS TD PT72
1.0000 | MEDICATED_PATCH | Freq: Once | TRANSDERMAL | Status: AC | PRN
  Administered 2017-08-10: 1 via TRANSDERMAL

## 2017-08-10 MED ORDER — SODIUM CHLORIDE 0.9% FLUSH: 3.0000 mL | INTRAVENOUS | Status: DC | PRN

## 2017-08-10 MED ORDER — DEXAMETHASONE SODIUM PHOSPHATE 10 MG/ML IJ SOLN
INTRAMUSCULAR | Status: AC
Start: 2017-08-10 — End: 2017-08-10
  Filled 2017-08-10: qty 1

## 2017-08-10 MED ORDER — SUGAMMADEX SODIUM 200 MG/2ML IV SOLN
INTRAVENOUS | Status: DC | PRN
  Administered 2017-08-10: 200 mg via INTRAVENOUS

## 2017-08-10 MED ORDER — ROCURONIUM BROMIDE 100 MG/10ML IV SOLN
INTRAVENOUS | Status: DC | PRN
  Administered 2017-08-10: 10 mg via INTRAVENOUS
  Administered 2017-08-10: 50 mg via INTRAVENOUS

## 2017-08-10 MED ORDER — SODIUM CHLORIDE 0.9 % IR SOLN
Status: DC | PRN
  Administered 2017-08-10: 1

## 2017-08-10 MED ORDER — SODIUM CHLORIDE 0.9% FLUSH: 3.0000 mL | Freq: Two times a day (BID) | INTRAVENOUS | Status: DC

## 2017-08-10 MED ORDER — ONDANSETRON HCL 4 MG/2ML IJ SOLN
INTRAMUSCULAR | Status: DC | PRN
  Administered 2017-08-10: 4 mg via INTRAVENOUS

## 2017-08-10 MED ORDER — LIDOCAINE 2% (20 MG/ML) 5 ML SYRINGE
INTRAMUSCULAR | Status: AC
  Filled 2017-08-10: qty 5

## 2017-08-10 MED ORDER — ROCURONIUM BROMIDE 10 MG/ML (PF) SYRINGE
PREFILLED_SYRINGE | INTRAVENOUS | Status: AC
  Filled 2017-08-10: qty 5

## 2017-08-10 MED ORDER — SCOPOLAMINE 1 MG/3DAYS TD PT72
MEDICATED_PATCH | TRANSDERMAL | Status: AC
  Filled 2017-08-10: qty 1

## 2017-08-10 MED ORDER — FENTANYL CITRATE (PF) 100 MCG/2ML IJ SOLN
50.0000 ug | INTRAMUSCULAR | Status: DC | PRN
  Administered 2017-08-10: 100 ug via INTRAVENOUS

## 2017-08-10 MED ORDER — ONDANSETRON HCL 4 MG/2ML IJ SOLN
INTRAMUSCULAR | Status: AC
  Filled 2017-08-10: qty 2

## 2017-08-10 MED ORDER — FENTANYL CITRATE (PF) 100 MCG/2ML IJ SOLN
25.0000 ug | INTRAMUSCULAR | Status: DC | PRN
  Administered 2017-08-10: 50 ug via INTRAVENOUS

## 2017-08-10 MED ORDER — BUPIVACAINE-EPINEPHRINE 0.5% -1:200000 IJ SOLN
INTRAMUSCULAR | Status: DC | PRN
  Administered 2017-08-10: 15 mL

## 2017-08-10 MED ORDER — CEFAZOLIN SODIUM-DEXTROSE 2-4 GM/100ML-% IV SOLN
2.0000 g | INTRAVENOUS | Status: AC
  Administered 2017-08-10: 2 g via INTRAVENOUS

## 2017-08-10 MED ORDER — PROMETHAZINE HCL 25 MG/ML IJ SOLN
INTRAMUSCULAR | Status: AC
  Filled 2017-08-10: qty 1

## 2017-08-10 MED ORDER — PROPOFOL 10 MG/ML IV BOLUS
INTRAVENOUS | Status: DC | PRN
  Administered 2017-08-10: 30 mg via INTRAVENOUS
  Administered 2017-08-10: 200 mg via INTRAVENOUS

## 2017-08-10 MED ORDER — CEFAZOLIN SODIUM-DEXTROSE 2-4 GM/100ML-% IV SOLN
INTRAVENOUS | Status: AC
  Filled 2017-08-10: qty 100

## 2017-08-10 MED ORDER — LACTATED RINGERS IV SOLN
INTRAVENOUS | Status: DC
  Administered 2017-08-10: 12:00:00 via INTRAVENOUS

## 2017-08-10 MED ORDER — SODIUM CHLORIDE 0.9 % IV SOLN: 250.0000 mL | INTRAVENOUS | Status: DC | PRN

## 2017-08-10 MED ORDER — GABAPENTIN 300 MG PO CAPS
300.0000 mg | ORAL_CAPSULE | ORAL | Status: AC
  Administered 2017-08-10: 300 mg via ORAL

## 2017-08-10 MED ORDER — FENTANYL CITRATE (PF) 100 MCG/2ML IJ SOLN
25.0000 ug | INTRAMUSCULAR | Status: DC | PRN
  Administered 2017-08-10: 25 ug via INTRAVENOUS

## 2017-08-10 MED ORDER — CELECOXIB 200 MG PO CAPS
ORAL_CAPSULE | ORAL | Status: AC
  Filled 2017-08-10: qty 1

## 2017-08-10 MED ORDER — LIDOCAINE HCL (CARDIAC) 20 MG/ML IV SOLN
INTRAVENOUS | Status: DC | PRN
  Administered 2017-08-10: 50 mg via INTRAVENOUS

## 2017-08-10 MED ORDER — DEXAMETHASONE SODIUM PHOSPHATE 4 MG/ML IJ SOLN
INTRAMUSCULAR | Status: DC | PRN
  Administered 2017-08-10: 10 mg via INTRAVENOUS

## 2017-08-10 MED ORDER — IOHEXOL 300 MG/ML  SOLN
INTRAMUSCULAR | Status: DC | PRN
  Administered 2017-08-10: 17 mL

## 2017-08-10 MED ORDER — MIDAZOLAM HCL 2 MG/2ML IJ SOLN
INTRAMUSCULAR | Status: AC
  Filled 2017-08-10: qty 2

## 2017-08-10 MED ORDER — PROPOFOL 500 MG/50ML IV EMUL
INTRAVENOUS | Status: AC
Start: 2017-08-10 — End: 2017-08-10
  Filled 2017-08-10: qty 50

## 2017-08-10 MED ORDER — GABAPENTIN 300 MG PO CAPS
ORAL_CAPSULE | ORAL | Status: AC
  Filled 2017-08-10: qty 1

## 2017-08-10 MED ORDER — HYDROCODONE-ACETAMINOPHEN 5-325 MG PO TABS: 1.0000 | ORAL_TABLET | Freq: Four times a day (QID) | ORAL | 0 refills | Status: DC | PRN

## 2017-08-10 MED ORDER — FENTANYL CITRATE (PF) 100 MCG/2ML IJ SOLN
INTRAMUSCULAR | Status: AC
  Filled 2017-08-10: qty 2

## 2017-08-10 MED ORDER — ACETAMINOPHEN 325 MG PO TABS: 650.0000 mg | ORAL_TABLET | ORAL | Status: DC | PRN

## 2017-08-10 MED ORDER — PHENYLEPHRINE HCL 10 MG/ML IJ SOLN
INTRAMUSCULAR | Status: DC | PRN
  Administered 2017-08-10: 120 ug via INTRAVENOUS

## 2017-08-10 MED ORDER — ACETAMINOPHEN 500 MG PO TABS
1000.0000 mg | ORAL_TABLET | ORAL | Status: AC
  Administered 2017-08-10: 1000 mg via ORAL

## 2017-08-10 MED ORDER — EPHEDRINE SULFATE 50 MG/ML IJ SOLN
INTRAMUSCULAR | Status: DC | PRN
  Administered 2017-08-10: 10 mg via INTRAVENOUS

## 2017-08-10 MED ORDER — LACTATED RINGERS IV SOLN
INTRAVENOUS | Status: DC
  Administered 2017-08-10: 08:00:00 via INTRAVENOUS

## 2017-08-10 MED ORDER — MIDAZOLAM HCL 2 MG/2ML IJ SOLN
1.0000 mg | INTRAMUSCULAR | Status: DC | PRN
  Administered 2017-08-10: 2 mg via INTRAVENOUS

## 2017-08-10 MED ORDER — CELECOXIB 200 MG PO CAPS
200.0000 mg | ORAL_CAPSULE | ORAL | Status: AC
  Administered 2017-08-10: 200 mg via ORAL

## 2017-08-10 MED ORDER — ACETAMINOPHEN 650 MG RE SUPP: 650.0000 mg | RECTAL | Status: DC | PRN

## 2017-08-10 SURGICAL SUPPLY — 43 items
APPLICATOR ARISTA FLEXITIP XL (MISCELLANEOUS) IMPLANT
APPLIER CLIP ROT 10 11.4 M/L (STAPLE) ×2
BLADE CLIPPER SURG (BLADE) IMPLANT
CANISTER SUCT 1200ML W/VALVE (MISCELLANEOUS) ×2 IMPLANT
CHLORAPREP W/TINT 26ML (MISCELLANEOUS) ×2 IMPLANT
CLIP APPLIE ROT 10 11.4 M/L (STAPLE) ×1 IMPLANT
COVER MAYO STAND STRL (DRAPES) ×2 IMPLANT
DECANTER SPIKE VIAL GLASS SM (MISCELLANEOUS) ×2 IMPLANT
DERMABOND ADVANCED (GAUZE/BANDAGES/DRESSINGS) ×1
DERMABOND ADVANCED .7 DNX12 (GAUZE/BANDAGES/DRESSINGS) ×1 IMPLANT
DRAPE C-ARM 42X72 X-RAY (DRAPES) ×2 IMPLANT
DRAPE LAPAROSCOPIC ABDOMINAL (DRAPES) ×2 IMPLANT
ELECT REM PT RETURN 9FT ADLT (ELECTROSURGICAL) ×2
ELECTRODE REM PT RTRN 9FT ADLT (ELECTROSURGICAL) ×1 IMPLANT
FILTER SMOKE EVAC LAPAROSHD (FILTER) ×2 IMPLANT
GLOVE BIO SURGEON STRL SZ7 (GLOVE) ×2 IMPLANT
GLOVE BIOGEL PI IND STRL 7.0 (GLOVE) ×1 IMPLANT
GLOVE BIOGEL PI INDICATOR 7.0 (GLOVE) ×1
GLOVE EUDERMIC 7 POWDERFREE (GLOVE) ×2 IMPLANT
GLOVE SURG SS PI 6.5 STRL IVOR (GLOVE) ×2 IMPLANT
GLOVE SURG SS PI 7.0 STRL IVOR (GLOVE) ×2 IMPLANT
GOWN STRL REUS W/ TWL LRG LVL3 (GOWN DISPOSABLE) ×2 IMPLANT
GOWN STRL REUS W/ TWL XL LVL3 (GOWN DISPOSABLE) ×1 IMPLANT
GOWN STRL REUS W/TWL LRG LVL3 (GOWN DISPOSABLE) ×2
GOWN STRL REUS W/TWL XL LVL3 (GOWN DISPOSABLE) ×1
HEMOSTAT SNOW SURGICEL 2X4 (HEMOSTASIS) IMPLANT
NS IRRIG 1000ML POUR BTL (IV SOLUTION) ×2 IMPLANT
PACK BASIN DAY SURGERY FS (CUSTOM PROCEDURE TRAY) ×2 IMPLANT
POUCH SPECIMEN RETRIEVAL 10MM (ENDOMECHANICALS) ×2 IMPLANT
SCISSORS LAP 5X35 DISP (ENDOMECHANICALS) ×2 IMPLANT
SET CHOLANGIOGRAPH 5 50 .035 (SET/KITS/TRAYS/PACK) ×2 IMPLANT
SET IRRIG TUBING LAPAROSCOPIC (IRRIGATION / IRRIGATOR) ×2 IMPLANT
SLEEVE ENDOPATH XCEL 5M (ENDOMECHANICALS) ×2 IMPLANT
SLEEVE SCD COMPRESS KNEE MED (MISCELLANEOUS) ×2 IMPLANT
STRIP CLOSURE SKIN 1/2X4 (GAUZE/BANDAGES/DRESSINGS) IMPLANT
SUT MNCRL AB 4-0 PS2 18 (SUTURE) ×2 IMPLANT
SUT VICRYL 0 UR6 27IN ABS (SUTURE) IMPLANT
TOWEL OR 17X24 6PK STRL BLUE (TOWEL DISPOSABLE) ×2 IMPLANT
TRAY LAPAROSCOPIC (CUSTOM PROCEDURE TRAY) ×2 IMPLANT
TROCAR XCEL BLUNT TIP 100MML (ENDOMECHANICALS) ×2 IMPLANT
TROCAR XCEL NON-BLD 11X100MML (ENDOMECHANICALS) ×2 IMPLANT
TROCAR XCEL NON-BLD 5MMX100MML (ENDOMECHANICALS) ×2 IMPLANT
TUBING INSUFFLATION (TUBING) ×2 IMPLANT

## 2017-08-10 NOTE — Anesthesia Postprocedure Evaluation (Signed)
Anesthesia Post Note  Patient: Kari BaarsVeetta Shearon  Procedure(s) Performed: LAPAROSCOPIC CHOLECYSTECTOMY WITH INTRAOPERATIVE CHOLANGIOGRAM (N/A Abdomen)     Patient location during evaluation: PACU Anesthesia Type: General Level of consciousness: awake and alert Pain management: pain level controlled Vital Signs Assessment: post-procedure vital signs reviewed and stable Respiratory status: spontaneous breathing, nonlabored ventilation and respiratory function stable Cardiovascular status: blood pressure returned to baseline and stable Postop Assessment: no apparent nausea or vomiting Anesthetic complications: no    Last Vitals:  Vitals:   08/10/17 1145 08/10/17 1200  BP: 127/69 114/70  Pulse: (!) 59 62  Resp: 12 10  Temp:    SpO2: 95% 95%    Last Pain:  Vitals:   08/10/17 1045  TempSrc:   PainSc: 3                  Beryle Lathehomas E Brock

## 2017-08-10 NOTE — Anesthesia Preprocedure Evaluation (Signed)
Anesthesia Evaluation  Patient identified by MRN, date of birth, ID band Patient awake    Reviewed: Allergy & Precautions, NPO status , Patient's Chart, lab work & pertinent test results  History of Anesthesia Complications (+) PONV  Airway Mallampati: II  TM Distance: >3 FB Neck ROM: Full    Dental  (+) Dental Advisory Given   Pulmonary asthma ,    Pulmonary exam normal breath sounds clear to auscultation       Cardiovascular negative cardio ROS Normal cardiovascular exam Rhythm:Regular Rate:Normal     Neuro/Psych negative neurological ROS  negative psych ROS   GI/Hepatic Neg liver ROS, GERD  ,Gallstones   Endo/Other  Obesity  Renal/GU negative Renal ROS  negative genitourinary   Musculoskeletal negative musculoskeletal ROS (+)   Abdominal   Peds  Hematology  (+) anemia ,   Anesthesia Other Findings   Reproductive/Obstetrics                             Anesthesia Physical Anesthesia Plan  ASA: II  Anesthesia Plan: General   Post-op Pain Management:    Induction: Intravenous  PONV Risk Score and Plan: 4 or greater and Treatment may vary due to age or medical condition, Ondansetron, Dexamethasone, Midazolam and Scopolamine patch - Pre-op  Airway Management Planned: Oral ETT  Additional Equipment: None  Intra-op Plan:   Post-operative Plan: Extubation in OR  Informed Consent: I have reviewed the patients History and Physical, chart, labs and discussed the procedure including the risks, benefits and alternatives for the proposed anesthesia with the patient or authorized representative who has indicated his/her understanding and acceptance.   Dental advisory given  Plan Discussed with: CRNA  Anesthesia Plan Comments:         Anesthesia Quick Evaluation

## 2017-08-10 NOTE — Interval H&P Note (Signed)
History and Physical Interval Note:  08/10/2017 8:13 AM  Madeline Ramos  has presented today for surgery, with the diagnosis of GALLSTONES  The various methods of treatment have been discussed with the patient and family. After consideration of risks, benefits and other options for treatment, the patient has consented to  Procedure(s): LAPAROSCOPIC CHOLECYSTECTOMY WITH INTRAOPERATIVE CHOLANGIOGRAM (N/A) as a surgical intervention .  The patient's history has been reviewed, patient examined, no change in status, stable for surgery.  I have reviewed the patient's chart and labs.  Questions were answered to the patient's satisfaction.     Ernestene MentionHaywood M Kura Bethards

## 2017-08-10 NOTE — Anesthesia Procedure Notes (Signed)
Procedure Name: Intubation Performed by: Jilliam Bellmore W, CRNA Pre-anesthesia Checklist: Patient identified, Emergency Drugs available, Suction available and Patient being monitored Patient Re-evaluated:Patient Re-evaluated prior to induction Oxygen Delivery Method: Circle system utilized Preoxygenation: Pre-oxygenation with 100% oxygen Induction Type: IV induction Ventilation: Mask ventilation without difficulty Laryngoscope Size: Miller and 2 Grade View: Grade I Tube type: Oral Tube size: 7.0 mm Number of attempts: 1 Airway Equipment and Method: Stylet Placement Confirmation: ETT inserted through vocal cords under direct vision,  positive ETCO2 and breath sounds checked- equal and bilateral Secured at: 22 cm Tube secured with: Tape Dental Injury: Teeth and Oropharynx as per pre-operative assessment        

## 2017-08-10 NOTE — Op Note (Signed)
Patient Name:           Madeline Ramos   Date of Surgery:        08/10/2017  Pre op Diagnosis:      Chronic cholecystitis with cholelithiasis  Post op Diagnosis:    Same  Procedure:                 Laparoscopic cholecystectomy with intraoperative cholangiogram  Surgeon:                     Angelia Mould. Derrell Lolling, M.D., FACS  Assistant:                      OR staff  Operative Indications:    This is a very pleasant, 52 year old female, referred by Dr. Geoffry Paradise for evaluation of symptomatic gallstones.       She has been having gallbladder attacks for many years but they're becoming worse now becoming more severe in intensity and more frequent. She states that she is having almost daily attacks. She describes postprandial severe epigastric pain and nausea radiating to her back. Cheese and other fatty foods are triggers for this. She feels bloated. It will last anywhere from 20 minutes to 2 hours and will then resolve. She has chronic alternating diarrhea and constipation, not necessarily exacerbated by abdominal pain attacks. On June 30, 2017 she had an ultrasound which shows multiple stones in the gallbladder up to 1.8 cm but no inflammatory changes. CBD 4.4 mm. She has not had any blood work. She is fearful of complications.        Because of accelerating symptoms she would like to go ahead with surgery. She has fairly typical biliary colic and it is highly likely that the gallbladder is the source of her symptoms. I will not think any further workup is necessary other than blood work      She'll be scheduled for laparoscopic cholecystectomy with cholangiogram, possible open cholecystectomy. She agrees with this plan.    Operative Findings:       The gallbladder was thin-walled.  Not acutely inflamed.  It was full of gallstones.  The cystic duct contained a few stones and was dilated.  The common bile duct was dilated.  I performed a cholangiogram and it was normal.  The  cholangiogram showed normal intrahepatic and extrahepatic biliary anatomy, no filling defect, and no obstruction with good flow of contrast into the duodenum.  The common bile duct was somewhat dilated, however.  The intra-abdominal viscera were otherwise normal on four-quadrant inspection at the beginning and the end of the case.  Procedure in Detail:          Following the induction of general endotracheal anesthesia the patient's abdomen was prepped and draped in a sterile fashion.  Surgical timeout was performed.  Intravenous antibiotics were given.  0.5% Marcaine with epinephrine was used as a local infiltration anesthetic.    A vertical incision was made at the lower rim of the umbilicus.  The fascia was incised in the midline and the abdominal cavity entered under direct vision.  An 11 mm Hassan trocar was placed and secured with the Purstring suture of 0 Vicryl.  Pneumoperitoneum was created.  Video camera was inserted.  Trochars placed in subxiphoid region and 2 trochars placed in the right upper quadrant.  The gallbladder was elevated.  I dissected the peritoneum off of the neck of the gallbladder.  I created a significant critical view  with a good window behind the cystic duct and the cystic artery.  The cystic artery was secured with metal clips and divided.  The cholangiogram catheter  was inserted into the cystic duct and a cholangiogram was obtained using the C-arm.  The cholangiogram was normal as described above.  The cholangiogram catheter was removed.  I milked a couple of small stones out of the cystic duct.  The cystic duct was secured with multiple metal clips and divided.  The gallbladder was dissected from its bed with electrocautery, placed in a specimen bag and removed.  The operative field was carefully irrigated and inspected.  There was no bleeding.  There was no bile leak.  Irrigation fluid became clear.  The trochars were removed under direct vision.  There was no bleeding.  The  fascia at the umbilicus was closed with 0 Vicryl sutures.  The skin incisions were closed with subcuticular sutures of 4-0 Monocryl and Dermabond.  Patient tolerated the procedure well was taken to PACU in stable condition.  EBL 15 mL.  Counts correct.  Complications none.      Angelia MouldHaywood M. Derrell LollingIngram, M.D., FACS General and Minimally Invasive Surgery Breast and Colorectal Surgery    Addendum: I logged on to the [facility] website and reviewed her prescription medication history  08/10/2017 10:09 AM

## 2017-08-10 NOTE — Discharge Instructions (Signed)
°Post Anesthesia Home Care Instructions ° °Activity: °Get plenty of rest for the remainder of the day. A responsible individual must stay with you for 24 hours following the procedure.  °For the next 24 hours, DO NOT: °-Drive a car °-Operate machinery °-Drink alcoholic beverages °-Take any medication unless instructed by your physician °-Make any legal decisions or sign important papers. ° °Meals: °Start with liquid foods such as gelatin or soup. Progress to regular foods as tolerated. Avoid greasy, spicy, heavy foods. If nausea and/or vomiting occur, drink only clear liquids until the nausea and/or vomiting subsides. Call your physician if vomiting continues. ° °Special Instructions/Symptoms: °Your throat may feel dry or sore from the anesthesia or the breathing tube placed in your throat during surgery. If this causes discomfort, gargle with warm salt water. The discomfort should disappear within 24 hours. ° °If you had a scopolamine patch placed behind your ear for the management of post- operative nausea and/or vomiting: ° °1. The medication in the patch is effective for 72 hours, after which it should be removed.  Wrap patch in a tissue and discard in the trash. Wash hands thoroughly with soap and water. °2. You may remove the patch earlier than 72 hours if you experience unpleasant side effects which may include dry mouth, dizziness or visual disturbances. °3. Avoid touching the patch. Wash your hands with soap and water after contact with the patch. °  °[facility] ______[facility] °LAPAROSCOPIC SURGERY: POST OP INSTRUCTIONS °Always review your discharge instruction sheet given to you by the facility where your surgery was performed. °IF YOU HAVE DISABILITY OR FAMILY LEAVE FORMS, YOU MUST BRING THEM TO THE OFFICE FOR PROCESSING.   °DO NOT GIVE THEM TO YOUR DOCTOR. ° °1. A prescription for pain medication may be given to you upon discharge.  Take your pain medication as prescribed, if needed.  If  narcotic pain medicine is not needed, then you may take acetaminophen (Tylenol) or ibuprofen (Advil) as needed. °2. Take your usually prescribed medications unless otherwise directed. °3. If you need a refill on your pain medication, please contact your pharmacy.  They will contact our office to request authorization. Prescriptions will not be filled after 5pm or on week-ends. °4. You should follow a light diet the first few days after arrival home, such as soup and crackers, etc.  Be sure to include lots of fluids daily. °5. Most patients will experience some swelling and bruising in the area of the incisions.  Ice packs will help.  Swelling and bruising can take several days to resolve.  °6. It is common to experience some constipation if taking pain medication after surgery.  Increasing fluid intake and taking a stool softener (such as Colace) will usually help or prevent this problem from occurring.  A mild laxative (Milk of Magnesia or Miralax) should be taken according to package instructions if there are no bowel movements after 48 hours. °7. Unless discharge instructions indicate otherwise, you may remove your bandages 24-48 hours after surgery, and you may shower at that time.  You may have steri-strips (small skin tapes) in place directly over the incision.  These strips should be left on the skin for 7-10 days.  If your surgeon used skin glue on the incision, you may shower in 24 hours.  The glue will flake off over the next 2-3 weeks.  Any sutures or staples will be removed at the office during your follow-up visit. °8. ACTIVITIES:  You may resume regular (light)   daily activities beginning the next day--such as daily self-care, walking, climbing stairs--gradually increasing activities as tolerated.  You may have sexual intercourse when it is comfortable.  Refrain from any heavy lifting or straining until approved by your doctor. °a. You may drive when you are no longer taking prescription pain  medication, you can comfortably wear a seatbelt, and you can safely maneuver your car and apply brakes. °b. RETURN TO WORK:  __________________________________________________________ °9. You should see your doctor in the office for a follow-up appointment approximately 2-3 weeks after your surgery.  Make sure that you call for this appointment within a day or two after you arrive home to insure a convenient appointment time. °10. OTHER INSTRUCTIONS: __________________________________________________________________________________________________________________________ __________________________________________________________________________________________________________________________ °WHEN TO CALL YOUR DOCTOR: °1. Fever over 101.0 °2. Inability to urinate °3. Continued bleeding from incision. °4. Increased pain, redness, or drainage from the incision. °5. Increasing abdominal pain ° °The clinic staff is available to answer your questions during regular business hours.  Please don’t hesitate to call and ask to speak to one of the nurses for clinical concerns.  If you have a medical emergency, go to the nearest emergency room or call 911.  A surgeon from [facility] is always on call at the hospital. °[address], [address], [place], [place]  [address] ? [address] [address], [place], [place]   [address] °[phone] ? [phone] ? FAX [phone] °Web site: [email or web address] ° °

## 2017-08-10 NOTE — Transfer of Care (Signed)
Immediate Anesthesia Transfer of Care Note  Patient: Madeline BaarsVeetta Gemmill  Procedure(s) Performed: LAPAROSCOPIC CHOLECYSTECTOMY WITH INTRAOPERATIVE CHOLANGIOGRAM (N/A Abdomen)  Patient Location: PACU  Anesthesia Type:General  Level of Consciousness: awake and sedated  Airway & Oxygen Therapy: Patient Spontanous Breathing and Patient connected to face mask oxygen  Post-op Assessment: Report given to RN and Post -op Vital signs reviewed and stable  Post vital signs: Reviewed and stable  Last Vitals:  Vitals:   08/10/17 0821  BP: 124/65  Pulse: 61  Resp: 20  Temp: 36.9 C  SpO2: 99%    Last Pain:  Vitals:   08/10/17 0821  TempSrc: Oral         Complications: No apparent anesthesia complications

## 2017-08-13 ENCOUNTER — Encounter (HOSPITAL_BASED_OUTPATIENT_CLINIC_OR_DEPARTMENT_OTHER): Payer: Self-pay | Admitting: General Surgery

## 2017-12-23 DIAGNOSIS — J4520 Mild intermittent asthma, uncomplicated: Secondary | ICD-10-CM | POA: Diagnosis not present

## 2017-12-23 DIAGNOSIS — R0602 Shortness of breath: Secondary | ICD-10-CM | POA: Diagnosis not present

## 2018-01-01 ENCOUNTER — Ambulatory Visit
Admission: RE | Admit: 2018-01-01 | Discharge: 2018-01-01 | Disposition: A | Discharge status: Home / Self Care | Payer: BLUE CROSS/BLUE SHIELD | Source: Ambulatory Visit | Attending: Internal Medicine | Admitting: Internal Medicine

## 2018-01-01 ENCOUNTER — Other Ambulatory Visit: Payer: Self-pay | Admitting: Internal Medicine

## 2018-01-01 DIAGNOSIS — K5732 Diverticulitis of large intestine without perforation or abscess without bleeding: Secondary | ICD-10-CM | POA: Diagnosis not present

## 2018-01-01 DIAGNOSIS — Z6835 Body mass index (BMI) 35.0-35.9, adult: Secondary | ICD-10-CM | POA: Diagnosis not present

## 2018-01-01 DIAGNOSIS — R1032 Left lower quadrant pain: Secondary | ICD-10-CM

## 2018-01-01 DIAGNOSIS — R197 Diarrhea, unspecified: Secondary | ICD-10-CM | POA: Diagnosis not present

## 2018-01-01 DIAGNOSIS — R109 Unspecified abdominal pain: Secondary | ICD-10-CM | POA: Diagnosis not present

## 2018-01-01 MED ORDER — IOPAMIDOL (ISOVUE-300) INJECTION 61%
125.0000 mL | Freq: Once | INTRAVENOUS | Status: AC | PRN
  Administered 2018-01-01: 125 mL via INTRAVENOUS

## 2018-01-03 ENCOUNTER — Other Ambulatory Visit: Payer: Self-pay | Admitting: Internal Medicine

## 2018-05-17 DIAGNOSIS — J45909 Unspecified asthma, uncomplicated: Secondary | ICD-10-CM | POA: Diagnosis not present

## 2018-06-12 ENCOUNTER — Other Ambulatory Visit: Payer: Self-pay | Admitting: Nurse Practitioner

## 2018-06-12 DIAGNOSIS — R82998 Other abnormal findings in urine: Secondary | ICD-10-CM | POA: Diagnosis not present

## 2018-06-12 DIAGNOSIS — Z1231 Encounter for screening mammogram for malignant neoplasm of breast: Secondary | ICD-10-CM

## 2018-06-12 DIAGNOSIS — Z0000 Encounter for general adult medical examination without abnormal findings: Secondary | ICD-10-CM | POA: Diagnosis not present

## 2018-06-19 DIAGNOSIS — Z0000 Encounter for general adult medical examination without abnormal findings: Secondary | ICD-10-CM | POA: Diagnosis not present

## 2018-06-19 DIAGNOSIS — E669 Obesity, unspecified: Secondary | ICD-10-CM | POA: Diagnosis not present

## 2018-06-19 DIAGNOSIS — Z23 Encounter for immunization: Secondary | ICD-10-CM | POA: Diagnosis not present

## 2018-06-19 DIAGNOSIS — K219 Gastro-esophageal reflux disease without esophagitis: Secondary | ICD-10-CM | POA: Diagnosis not present

## 2018-06-19 DIAGNOSIS — R002 Palpitations: Secondary | ICD-10-CM | POA: Diagnosis not present

## 2018-06-19 DIAGNOSIS — D499 Neoplasm of unspecified behavior of unspecified site: Secondary | ICD-10-CM | POA: Diagnosis not present

## 2018-06-19 DIAGNOSIS — Z1389 Encounter for screening for other disorder: Secondary | ICD-10-CM | POA: Diagnosis not present

## 2018-07-24 ENCOUNTER — Other Ambulatory Visit: Payer: Self-pay | Admitting: Internal Medicine

## 2018-07-24 ENCOUNTER — Ambulatory Visit
Admission: RE | Admit: 2018-07-24 | Discharge: 2018-07-24 | Disposition: A | Discharge status: Home / Self Care | Payer: BLUE CROSS/BLUE SHIELD | Source: Ambulatory Visit | Attending: Nurse Practitioner | Admitting: Nurse Practitioner

## 2018-07-24 DIAGNOSIS — Z1231 Encounter for screening mammogram for malignant neoplasm of breast: Secondary | ICD-10-CM

## 2019-03-08 ENCOUNTER — Other Ambulatory Visit: Payer: Self-pay

## 2019-03-08 ENCOUNTER — Emergency Department (HOSPITAL_COMMUNITY)
Admission: EM | Admit: 2019-03-08 | Discharge: 2019-03-08 | Disposition: A | Discharge status: Home / Self Care | Payer: BLUE CROSS/BLUE SHIELD | Attending: Emergency Medicine | Admitting: Emergency Medicine

## 2019-03-08 ENCOUNTER — Encounter (HOSPITAL_COMMUNITY): Payer: Self-pay | Admitting: Emergency Medicine

## 2019-03-08 ENCOUNTER — Emergency Department (HOSPITAL_COMMUNITY): Payer: BLUE CROSS/BLUE SHIELD

## 2019-03-08 DIAGNOSIS — J45909 Unspecified asthma, uncomplicated: Secondary | ICD-10-CM | POA: Insufficient documentation

## 2019-03-08 DIAGNOSIS — N200 Calculus of kidney: Secondary | ICD-10-CM | POA: Insufficient documentation

## 2019-03-08 DIAGNOSIS — K5730 Diverticulosis of large intestine without perforation or abscess without bleeding: Secondary | ICD-10-CM | POA: Diagnosis not present

## 2019-03-08 DIAGNOSIS — N132 Hydronephrosis with renal and ureteral calculous obstruction: Secondary | ICD-10-CM | POA: Diagnosis not present

## 2019-03-08 DIAGNOSIS — R109 Unspecified abdominal pain: Secondary | ICD-10-CM | POA: Diagnosis not present

## 2019-03-08 DIAGNOSIS — Z79899 Other long term (current) drug therapy: Secondary | ICD-10-CM | POA: Insufficient documentation

## 2019-03-08 LAB — CBC
HCT: 33.3 % — ABNORMAL LOW (ref 36.0–46.0)
Hemoglobin: 10 g/dL — ABNORMAL LOW (ref 12.0–15.0)
MCH: 26.2 pg (ref 26.0–34.0)
MCHC: 30 g/dL (ref 30.0–36.0)
MCV: 87.2 fL (ref 80.0–100.0)
Platelets: 292 10*3/uL (ref 150–400)
RBC: 3.82 MIL/uL — ABNORMAL LOW (ref 3.87–5.11)
RDW: 15.5 % (ref 11.5–15.5)
WBC: 8.2 10*3/uL (ref 4.0–10.5)
nRBC: 0 % (ref 0.0–0.2)

## 2019-03-08 LAB — COMPREHENSIVE METABOLIC PANEL
ALT: 14 U/L (ref 0–44)
AST: 18 U/L (ref 15–41)
Albumin: 3.4 g/dL — ABNORMAL LOW (ref 3.5–5.0)
Alkaline Phosphatase: 72 U/L (ref 38–126)
Anion gap: 12 (ref 5–15)
BUN: 18 mg/dL (ref 6–20)
CO2: 22 mmol/L (ref 22–32)
Calcium: 9.3 mg/dL (ref 8.9–10.3)
Chloride: 103 mmol/L (ref 98–111)
Creatinine, Ser: 1.01 mg/dL — ABNORMAL HIGH (ref 0.44–1.00)
GFR calc Af Amer: >60 mL/min (ref >60)
GFR calc non Af Amer: >60 mL/min (ref >60)
Glucose, Bld: 111 mg/dL — ABNORMAL HIGH (ref 70–99)
Potassium: 3.9 mmol/L (ref 3.5–5.1)
Sodium: 137 mmol/L (ref 135–145)
Total Bilirubin: 0.4 mg/dL (ref 0.3–1.2)
Total Protein: 7 g/dL (ref 6.5–8.1)

## 2019-03-08 LAB — URINALYSIS, ROUTINE W REFLEX MICROSCOPIC
Bilirubin Urine: NEGATIVE
Glucose, UA: NEGATIVE mg/dL
Ketones, ur: NEGATIVE mg/dL
Nitrite: NEGATIVE
Protein, ur: NEGATIVE mg/dL
RBC / HPF: >50 RBC/hpf — ABNORMAL HIGH (ref 0–5)
Specific Gravity, Urine: 1.025 (ref 1.005–1.030)
pH: 5 (ref 5.0–8.0)

## 2019-03-08 LAB — I-STAT BETA HCG BLOOD, ED (MC, WL, AP ONLY): I-stat hCG, quantitative: <5 m[IU]/mL (ref <5)

## 2019-03-08 LAB — LIPASE, BLOOD: Lipase: 36 U/L (ref 11–51)

## 2019-03-08 MED ORDER — OXYCODONE-ACETAMINOPHEN 5-325 MG PO TABS: 1.0000 | ORAL_TABLET | Freq: Four times a day (QID) | ORAL | 0 refills | Status: AC | PRN

## 2019-03-08 MED ORDER — OXYCODONE-ACETAMINOPHEN 5-325 MG PO TABS
2.0000 | ORAL_TABLET | Freq: Once | ORAL | Status: AC
  Administered 2019-03-08: 2 via ORAL
  Filled 2019-03-08: qty 2

## 2019-03-08 MED ORDER — SODIUM CHLORIDE 0.9% FLUSH
3.0000 mL | Freq: Once | INTRAVENOUS | Status: AC
  Administered 2019-03-08: 3 mL via INTRAVENOUS

## 2019-03-08 MED ORDER — PROMETHAZINE HCL 25 MG PO TABS: 25.0000 mg | ORAL_TABLET | Freq: Four times a day (QID) | ORAL | 0 refills | Status: AC | PRN

## 2019-03-08 MED ORDER — ONDANSETRON HCL 4 MG/2ML IJ SOLN
INTRAMUSCULAR | Status: AC
  Filled 2019-03-08: qty 2

## 2019-03-08 MED ORDER — TAMSULOSIN HCL 0.4 MG PO CAPS: 0.4000 mg | ORAL_CAPSULE | Freq: Every day | ORAL | 0 refills | Status: AC

## 2019-03-08 MED ORDER — ONDANSETRON HCL 4 MG/2ML IJ SOLN
4.0000 mg | Freq: Once | INTRAMUSCULAR | Status: AC
  Administered 2019-03-08: 4 mg via INTRAVENOUS

## 2019-03-08 MED ORDER — FENTANYL CITRATE (PF) 100 MCG/2ML IJ SOLN
100.0000 ug | Freq: Once | INTRAMUSCULAR | Status: AC
  Administered 2019-03-08: 100 ug via INTRAVENOUS
  Filled 2019-03-08: qty 2

## 2019-03-08 MED ORDER — NAPROXEN 500 MG PO TABS: 500.0000 mg | ORAL_TABLET | Freq: Two times a day (BID) | ORAL | 0 refills | Status: AC

## 2019-03-08 MED ORDER — FENTANYL CITRATE (PF) 100 MCG/2ML IJ SOLN
50.0000 ug | Freq: Once | INTRAMUSCULAR | Status: AC
  Administered 2019-03-08: 50 ug via INTRAVENOUS
  Filled 2019-03-08: qty 2

## 2019-03-08 MED ORDER — ONDANSETRON HCL 4 MG/2ML IJ SOLN
4.0000 mg | Freq: Once | INTRAMUSCULAR | Status: AC
  Administered 2019-03-08: 4 mg via INTRAVENOUS
  Filled 2019-03-08: qty 2

## 2019-03-08 MED ORDER — PROMETHAZINE HCL 25 MG/ML IJ SOLN
25.0000 mg | Freq: Once | INTRAMUSCULAR | Status: AC
  Administered 2019-03-08: 25 mg via INTRAMUSCULAR
  Filled 2019-03-08: qty 1

## 2019-03-08 NOTE — ED Triage Notes (Signed)
Pt reports umbilicus pain that radiates to the right side. Pt reports same started about an hour ago.

## 2019-03-08 NOTE — ED Provider Notes (Signed)
[facility]   CSN: [id number] Arrival date & time: 03/08/19  0425     History   Chief Complaint Chief Complaint  Patient presents with   Abdominal Pain    Mid ABD radiating to the right    HPI Madeline BaarsVeetta Ramos is a 53 y.o. female.     The history is provided by the patient.  Abdominal Pain Pain location:  RLQ and R flank Pain quality: sharp   Pain severity:  Severe Onset quality:  Sudden Duration:  2 hours Timing:  Constant Progression:  Worsening Chronicity:  New Relieved by:  Nothing Worsened by:  Movement and palpation Associated symptoms: nausea   Associated symptoms: no fever   Patient with history of asthma and GERD presents with abdominal pain.  She reports sudden onset of right lower abdominal pain and right flank pain.  She reports at times the pain appears to radiate into her leg.  She reports nausea.  She has never had this pain before.  She had otherwise been well.  Past Medical History:  Diagnosis Date   Asthma    Complication of anesthesia    Gallstones    Gallstones 08/10/2017   GERD (gastroesophageal reflux disease)    PONV (postoperative nausea and vomiting)     Patient Active Problem List   Diagnosis Date Noted   Gallstones 08/10/2017    Past Surgical History:  Procedure Laterality Date   ABDOMINAL HYSTERECTOMY     BREAST BIOPSY Right    CHOLECYSTECTOMY N/A 08/10/2017   Procedure: LAPAROSCOPIC CHOLECYSTECTOMY WITH INTRAOPERATIVE CHOLANGIOGRAM;  Surgeon: Claud KelpIngram, Haywood, MD;  Location: [facility];  Service: General;  Laterality: N/A;   EYE SURGERY     cataract   KNEE SURGERY Left      OB History   No obstetric history on file.      Home Medications    Prior to Admission medications   Medication Sig Start Date End Date Taking? Authorizing Provider  albuterol (PROVENTIL HFA;VENTOLIN HFA) 108 (90 Base) MCG/ACT inhaler Inhale into the lungs every 6 (six) hours  as needed for wheezing or shortness of breath.    [provider]  budesonide-formoterol (SYMBICORT) 160-4.5 MCG/ACT inhaler Inhale 2 puffs into the lungs 2 (two) times daily.    [provider]  HYDROcodone-acetaminophen (NORCO) 5-325 MG tablet Take 1-2 tablets by mouth every 6 (six) hours as needed for moderate pain or severe pain. 08/10/17   Claud KelpIngram, Haywood, MD  omeprazole (PRILOSEC) 20 MG capsule Take 20 mg by mouth daily.    [provider]    Family History Family History  Problem Relation Age of Onset   Breast cancer Paternal Grandmother    Breast cancer Cousin     Social History Social History   Tobacco Use   Smoking status: Never Smoker   Smokeless tobacco: Never Used  Substance Use Topics   Alcohol use: No   Drug use: No     Allergies   Codeine and Latex   Review of Systems Review of Systems  Constitutional: Negative for fever.  Gastrointestinal: Positive for abdominal pain and nausea.  Genitourinary: Positive for flank pain.  All other systems reviewed and are negative.    Physical Exam Updated Vital Signs BP (!) 171/79 (BP Location: Right Arm)    Pulse 66    Temp 98.5 F (36.9 C) (Oral)    Resp 18    Ht 1.651 m (5\' 5" )    Wt 95.3  kg    LMP  (Exact Date)    SpO2 100%    BMI 34.95 kg/m   Physical Exam CONSTITUTIONAL: Well developed/well nourished, uncomfortable appearing, anxious HEAD: Normocephalic/atraumatic EYES: EOMI NECK: supple no meningeal signs SPINE/BACK:entire spine nontender CV: S1/S2 noted, no murmurs/rubs/gallops noted LUNGS: Lungs are clear to auscultation bilaterally, no apparent distress ABDOMEN: soft, mild RLQ tenderness, no rebound or guarding, bowel sounds noted throughout abdomen GU:no cva tenderness NEURO: Pt is awake/alert/appropriate, moves all extremitiesx4.  No facial droop.   EXTREMITIES: pulses normal/equal, full ROM SKIN: warm, color normal PSYCH: Anxious  ED Treatments / Results   Labs (all labs ordered are listed, but only abnormal results are displayed) Labs Reviewed  COMPREHENSIVE METABOLIC PANEL - Abnormal; Notable for the following components:      Result Value   Glucose, Bld 111 (*)    Creatinine, Ser 1.01 (*)    Albumin 3.4 (*)    All other components within normal limits  CBC - Abnormal; Notable for the following components:   RBC 3.82 (*)    Hemoglobin 10.0 (*)    HCT 33.3 (*)    All other components within normal limits  URINALYSIS, ROUTINE W REFLEX MICROSCOPIC - Abnormal; Notable for the following components:   APPearance HAZY (*)    Hgb urine dipstick LARGE (*)    Leukocytes,Ua TRACE (*)    RBC / HPF >50 (*)    Bacteria, UA RARE (*)    All other components within normal limits  LIPASE, BLOOD  I-STAT BETA HCG BLOOD, ED ([facility], [facility], [facility] ONLY)    EKG None  Radiology No results found.  Procedures Procedures   Medications Ordered in ED Medications  sodium chloride flush (NS) 0.9 % injection 3 mL (has no administration in time range)  fentaNYL (SUBLIMAZE) injection 100 mcg (has no administration in time range)  ondansetron (ZOFRAN) injection 4 mg (has no administration in time range)     Initial Impression / Assessment and Plan / ED Course  I have reviewed the triage vital signs and the nursing notes.  Pertinent labs & imaging results that were available during my care of the patient were reviewed by me and considered in my medical decision making (see chart for details).        6:07 AM Patient with abrupt onset of abdominal pain.  She has hematuria.  Strong suspicion for ureteral stone.  CT imaging in the past is revealed nonobstructive kidney stone.  However due to the intensity of her pain, will proceed with CT imaging 6:46 AM Plan at signout to Dr. Sabra Heck to follow-up on CT imaging. Final Clinical Impressions(s) / ED Diagnoses   Final diagnoses:  None    ED Discharge Orders    None       Ripley Fraise, MD 03/08/19  [phone]

## 2019-03-08 NOTE — Discharge Instructions (Signed)
Your testing shows that you have a small kidney stone that will likely pass today or tomorrow (though this is unpredictable), take the medicines as needed  Flomax daily for 5 days (until stone passes) Naprosyn twice daily Zofran as needed every 6 hours for nausea Percocet only for severe pain  ER for worsening pain / vomiting

## 2019-03-08 NOTE — ED Provider Notes (Signed)
I have seen this patient, she does not have any red flags on her labs, she has hematuria consistent with kidney stone and based on my evaluation of the CT scan I think there is probably a right-sided distal ureteral vascular junction kidney stone.  There is no signs of infection in the urine and the patient otherwise appears well.  Her symptoms are improved after medications, anticipate discharge   Madeline Chapel, MD 03/08/19 [phone]

## 2019-03-08 NOTE — ED Notes (Signed)
Pt verbalized understanding of d/c instructions and has no further questions, VSS, NAD. Pt to follow up with urology.

## 2019-11-03 DIAGNOSIS — R002 Palpitations: Secondary | ICD-10-CM | POA: Diagnosis not present

## 2019-11-03 DIAGNOSIS — K219 Gastro-esophageal reflux disease without esophagitis: Secondary | ICD-10-CM | POA: Diagnosis not present

## 2019-11-03 DIAGNOSIS — Z0000 Encounter for general adult medical examination without abnormal findings: Secondary | ICD-10-CM | POA: Diagnosis not present

## 2019-11-03 DIAGNOSIS — R7989 Other specified abnormal findings of blood chemistry: Secondary | ICD-10-CM | POA: Diagnosis not present

## 2019-11-03 DIAGNOSIS — R82998 Other abnormal findings in urine: Secondary | ICD-10-CM | POA: Diagnosis not present

## 2019-11-03 DIAGNOSIS — J45909 Unspecified asthma, uncomplicated: Secondary | ICD-10-CM | POA: Diagnosis not present

## 2020-08-02 DIAGNOSIS — R051 Acute cough: Secondary | ICD-10-CM | POA: Diagnosis not present

## 2020-08-02 DIAGNOSIS — J209 Acute bronchitis, unspecified: Secondary | ICD-10-CM | POA: Diagnosis not present

## 2020-08-02 DIAGNOSIS — R0981 Nasal congestion: Secondary | ICD-10-CM | POA: Diagnosis not present

## 2020-08-02 DIAGNOSIS — Z20828 Contact with and (suspected) exposure to other viral communicable diseases: Secondary | ICD-10-CM | POA: Diagnosis not present

## 2021-02-22 DIAGNOSIS — K469 Unspecified abdominal hernia without obstruction or gangrene: Secondary | ICD-10-CM | POA: Diagnosis not present

## 2021-02-22 DIAGNOSIS — J45909 Unspecified asthma, uncomplicated: Secondary | ICD-10-CM | POA: Diagnosis not present

## 2021-02-22 DIAGNOSIS — Z7689 Persons encountering health services in other specified circumstances: Secondary | ICD-10-CM | POA: Diagnosis not present

## 2021-02-22 DIAGNOSIS — Z131 Encounter for screening for diabetes mellitus: Secondary | ICD-10-CM | POA: Diagnosis not present

## 2021-03-02 DIAGNOSIS — Z131 Encounter for screening for diabetes mellitus: Secondary | ICD-10-CM | POA: Diagnosis not present

## 2021-03-02 DIAGNOSIS — Z13220 Encounter for screening for lipoid disorders: Secondary | ICD-10-CM | POA: Diagnosis not present

## 2021-03-02 DIAGNOSIS — Z1329 Encounter for screening for other suspected endocrine disorder: Secondary | ICD-10-CM | POA: Diagnosis not present

## 2021-03-16 ENCOUNTER — Inpatient Hospital Stay: Admission: RE | Admit: 2021-03-16 | Payer: BLUE CROSS/BLUE SHIELD | Source: Ambulatory Visit

## 2021-03-16 ENCOUNTER — Ambulatory Visit
Admission: RE | Admit: 2021-03-16 | Discharge: 2021-03-16 | Disposition: A | Discharge status: Home / Self Care | Payer: BC Managed Care – PPO | Source: Ambulatory Visit | Attending: Family Medicine | Admitting: Family Medicine

## 2021-03-16 ENCOUNTER — Other Ambulatory Visit: Payer: Self-pay | Admitting: Family Medicine

## 2021-03-16 DIAGNOSIS — J45909 Unspecified asthma, uncomplicated: Secondary | ICD-10-CM | POA: Diagnosis not present

## 2021-03-16 DIAGNOSIS — Z01419 Encounter for gynecological examination (general) (routine) without abnormal findings: Secondary | ICD-10-CM | POA: Diagnosis not present

## 2021-03-16 DIAGNOSIS — Z0000 Encounter for general adult medical examination without abnormal findings: Secondary | ICD-10-CM

## 2021-03-16 DIAGNOSIS — Z1231 Encounter for screening mammogram for malignant neoplasm of breast: Secondary | ICD-10-CM | POA: Diagnosis not present

## 2021-03-16 DIAGNOSIS — Z0001 Encounter for general adult medical examination with abnormal findings: Secondary | ICD-10-CM | POA: Diagnosis not present

## 2021-03-16 DIAGNOSIS — R39198 Other difficulties with micturition: Secondary | ICD-10-CM | POA: Diagnosis not present

## 2021-03-16 DIAGNOSIS — R7303 Prediabetes: Secondary | ICD-10-CM | POA: Diagnosis not present

## 2021-03-16 DIAGNOSIS — Z1211 Encounter for screening for malignant neoplasm of colon: Secondary | ICD-10-CM | POA: Diagnosis not present

## 2021-03-16 DIAGNOSIS — N951 Menopausal and female climacteric states: Secondary | ICD-10-CM | POA: Diagnosis not present

## 2021-03-18 DIAGNOSIS — K432 Incisional hernia without obstruction or gangrene: Secondary | ICD-10-CM | POA: Diagnosis not present

## 2021-03-28 DIAGNOSIS — Z1212 Encounter for screening for malignant neoplasm of rectum: Secondary | ICD-10-CM | POA: Diagnosis not present

## 2021-03-28 DIAGNOSIS — Z1211 Encounter for screening for malignant neoplasm of colon: Secondary | ICD-10-CM | POA: Diagnosis not present

## 2021-03-31 DIAGNOSIS — Z87891 Personal history of nicotine dependence: Secondary | ICD-10-CM | POA: Diagnosis not present

## 2021-03-31 DIAGNOSIS — K432 Incisional hernia without obstruction or gangrene: Secondary | ICD-10-CM | POA: Diagnosis not present

## 2021-03-31 DIAGNOSIS — J45909 Unspecified asthma, uncomplicated: Secondary | ICD-10-CM | POA: Diagnosis not present

## 2021-04-12 DIAGNOSIS — Z09 Encounter for follow-up examination after completed treatment for conditions other than malignant neoplasm: Secondary | ICD-10-CM | POA: Diagnosis not present

## 2021-06-09 DIAGNOSIS — M7541 Impingement syndrome of right shoulder: Secondary | ICD-10-CM | POA: Diagnosis not present

## 2021-06-09 DIAGNOSIS — M7542 Impingement syndrome of left shoulder: Secondary | ICD-10-CM | POA: Diagnosis not present

## 2021-06-13 DIAGNOSIS — M25511 Pain in right shoulder: Secondary | ICD-10-CM | POA: Diagnosis not present

## 2021-06-13 DIAGNOSIS — M7542 Impingement syndrome of left shoulder: Secondary | ICD-10-CM | POA: Diagnosis not present

## 2021-06-13 DIAGNOSIS — M7541 Impingement syndrome of right shoulder: Secondary | ICD-10-CM | POA: Diagnosis not present

## 2021-06-13 DIAGNOSIS — M25512 Pain in left shoulder: Secondary | ICD-10-CM | POA: Diagnosis not present

## 2021-06-17 DIAGNOSIS — M7541 Impingement syndrome of right shoulder: Secondary | ICD-10-CM | POA: Diagnosis not present

## 2021-06-17 DIAGNOSIS — M25512 Pain in left shoulder: Secondary | ICD-10-CM | POA: Diagnosis not present

## 2021-06-17 DIAGNOSIS — M7542 Impingement syndrome of left shoulder: Secondary | ICD-10-CM | POA: Diagnosis not present

## 2021-06-17 DIAGNOSIS — M25511 Pain in right shoulder: Secondary | ICD-10-CM | POA: Diagnosis not present

## 2021-06-24 DIAGNOSIS — M7541 Impingement syndrome of right shoulder: Secondary | ICD-10-CM | POA: Diagnosis not present

## 2021-06-24 DIAGNOSIS — M7542 Impingement syndrome of left shoulder: Secondary | ICD-10-CM | POA: Diagnosis not present

## 2021-06-24 DIAGNOSIS — M25511 Pain in right shoulder: Secondary | ICD-10-CM | POA: Diagnosis not present

## 2021-06-24 DIAGNOSIS — M25512 Pain in left shoulder: Secondary | ICD-10-CM | POA: Diagnosis not present

## 2021-06-27 DIAGNOSIS — M25512 Pain in left shoulder: Secondary | ICD-10-CM | POA: Diagnosis not present

## 2021-06-27 DIAGNOSIS — M7542 Impingement syndrome of left shoulder: Secondary | ICD-10-CM | POA: Diagnosis not present

## 2021-06-27 DIAGNOSIS — M25511 Pain in right shoulder: Secondary | ICD-10-CM | POA: Diagnosis not present

## 2021-06-27 DIAGNOSIS — M7541 Impingement syndrome of right shoulder: Secondary | ICD-10-CM | POA: Diagnosis not present

## 2021-07-04 DIAGNOSIS — M7541 Impingement syndrome of right shoulder: Secondary | ICD-10-CM | POA: Diagnosis not present

## 2021-07-04 DIAGNOSIS — M25512 Pain in left shoulder: Secondary | ICD-10-CM | POA: Diagnosis not present

## 2021-07-04 DIAGNOSIS — M7542 Impingement syndrome of left shoulder: Secondary | ICD-10-CM | POA: Diagnosis not present

## 2021-07-04 DIAGNOSIS — M25511 Pain in right shoulder: Secondary | ICD-10-CM | POA: Diagnosis not present

## 2021-07-08 DIAGNOSIS — M25512 Pain in left shoulder: Secondary | ICD-10-CM | POA: Diagnosis not present

## 2021-07-08 DIAGNOSIS — M7542 Impingement syndrome of left shoulder: Secondary | ICD-10-CM | POA: Diagnosis not present

## 2021-07-08 DIAGNOSIS — M25511 Pain in right shoulder: Secondary | ICD-10-CM | POA: Diagnosis not present

## 2021-07-08 DIAGNOSIS — M7541 Impingement syndrome of right shoulder: Secondary | ICD-10-CM | POA: Diagnosis not present

## 2021-07-20 DIAGNOSIS — M7542 Impingement syndrome of left shoulder: Secondary | ICD-10-CM | POA: Diagnosis not present

## 2021-08-04 DIAGNOSIS — M25512 Pain in left shoulder: Secondary | ICD-10-CM | POA: Diagnosis not present

## 2021-08-10 DIAGNOSIS — M75122 Complete rotator cuff tear or rupture of left shoulder, not specified as traumatic: Secondary | ICD-10-CM | POA: Diagnosis not present

## 2021-08-10 DIAGNOSIS — M75100 Unspecified rotator cuff tear or rupture of unspecified shoulder, not specified as traumatic: Secondary | ICD-10-CM | POA: Diagnosis not present

## 2021-08-10 DIAGNOSIS — M7542 Impingement syndrome of left shoulder: Secondary | ICD-10-CM | POA: Diagnosis not present

## 2021-09-02 DIAGNOSIS — N6011 Diffuse cystic mastopathy of right breast: Secondary | ICD-10-CM | POA: Diagnosis not present

## 2021-09-02 DIAGNOSIS — Z6823 Body mass index (BMI) 23.0-23.9, adult: Secondary | ICD-10-CM | POA: Diagnosis not present

## 2021-09-26 DIAGNOSIS — M11212 Other chondrocalcinosis, left shoulder: Secondary | ICD-10-CM | POA: Diagnosis not present

## 2021-09-26 DIAGNOSIS — S46012A Strain of muscle(s) and tendon(s) of the rotator cuff of left shoulder, initial encounter: Secondary | ICD-10-CM | POA: Diagnosis not present

## 2021-09-26 DIAGNOSIS — M24112 Other articular cartilage disorders, left shoulder: Secondary | ICD-10-CM | POA: Diagnosis not present

## 2021-09-26 DIAGNOSIS — S46112A Strain of muscle, fascia and tendon of long head of biceps, left arm, initial encounter: Secondary | ICD-10-CM | POA: Diagnosis not present

## 2021-09-26 DIAGNOSIS — Y999 Unspecified external cause status: Secondary | ICD-10-CM | POA: Diagnosis not present

## 2021-09-26 DIAGNOSIS — G8918 Other acute postprocedural pain: Secondary | ICD-10-CM | POA: Diagnosis not present

## 2021-09-26 DIAGNOSIS — M94212 Chondromalacia, left shoulder: Secondary | ICD-10-CM | POA: Diagnosis not present

## 2021-09-26 DIAGNOSIS — X58XXXA Exposure to other specified factors, initial encounter: Secondary | ICD-10-CM | POA: Diagnosis not present

## 2021-09-26 DIAGNOSIS — M7542 Impingement syndrome of left shoulder: Secondary | ICD-10-CM | POA: Diagnosis not present

## 2021-09-29 DIAGNOSIS — M25512 Pain in left shoulder: Secondary | ICD-10-CM | POA: Diagnosis not present

## 2021-09-29 DIAGNOSIS — M6281 Muscle weakness (generalized): Secondary | ICD-10-CM | POA: Diagnosis not present

## 2021-10-04 DIAGNOSIS — M6281 Muscle weakness (generalized): Secondary | ICD-10-CM | POA: Diagnosis not present

## 2021-10-04 DIAGNOSIS — M25512 Pain in left shoulder: Secondary | ICD-10-CM | POA: Diagnosis not present

## 2021-10-06 DIAGNOSIS — M25512 Pain in left shoulder: Secondary | ICD-10-CM | POA: Diagnosis not present

## 2021-10-06 DIAGNOSIS — M6281 Muscle weakness (generalized): Secondary | ICD-10-CM | POA: Diagnosis not present

## 2021-10-11 DIAGNOSIS — M25512 Pain in left shoulder: Secondary | ICD-10-CM | POA: Diagnosis not present

## 2021-10-11 DIAGNOSIS — M6281 Muscle weakness (generalized): Secondary | ICD-10-CM | POA: Diagnosis not present

## 2021-10-14 DIAGNOSIS — M25512 Pain in left shoulder: Secondary | ICD-10-CM | POA: Diagnosis not present

## 2021-10-14 DIAGNOSIS — M6281 Muscle weakness (generalized): Secondary | ICD-10-CM | POA: Diagnosis not present

## 2021-10-18 DIAGNOSIS — M25512 Pain in left shoulder: Secondary | ICD-10-CM | POA: Diagnosis not present

## 2021-10-18 DIAGNOSIS — M6281 Muscle weakness (generalized): Secondary | ICD-10-CM | POA: Diagnosis not present

## 2021-10-20 DIAGNOSIS — M25512 Pain in left shoulder: Secondary | ICD-10-CM | POA: Diagnosis not present

## 2021-10-20 DIAGNOSIS — M6281 Muscle weakness (generalized): Secondary | ICD-10-CM | POA: Diagnosis not present

## 2021-10-25 DIAGNOSIS — M25512 Pain in left shoulder: Secondary | ICD-10-CM | POA: Diagnosis not present

## 2021-10-25 DIAGNOSIS — M6281 Muscle weakness (generalized): Secondary | ICD-10-CM | POA: Diagnosis not present

## 2021-10-27 DIAGNOSIS — M25512 Pain in left shoulder: Secondary | ICD-10-CM | POA: Diagnosis not present

## 2021-10-27 DIAGNOSIS — M6281 Muscle weakness (generalized): Secondary | ICD-10-CM | POA: Diagnosis not present

## 2021-11-01 DIAGNOSIS — M25512 Pain in left shoulder: Secondary | ICD-10-CM | POA: Diagnosis not present

## 2021-11-01 DIAGNOSIS — M6281 Muscle weakness (generalized): Secondary | ICD-10-CM | POA: Diagnosis not present

## 2021-11-03 DIAGNOSIS — M6281 Muscle weakness (generalized): Secondary | ICD-10-CM | POA: Diagnosis not present

## 2021-11-03 DIAGNOSIS — M25512 Pain in left shoulder: Secondary | ICD-10-CM | POA: Diagnosis not present

## 2022-02-08 DIAGNOSIS — K219 Gastro-esophageal reflux disease without esophagitis: Secondary | ICD-10-CM | POA: Diagnosis not present

## 2022-02-08 DIAGNOSIS — J45909 Unspecified asthma, uncomplicated: Secondary | ICD-10-CM | POA: Diagnosis not present

## 2022-02-08 DIAGNOSIS — Z6823 Body mass index (BMI) 23.0-23.9, adult: Secondary | ICD-10-CM | POA: Diagnosis not present

## 2022-03-17 DIAGNOSIS — Z13220 Encounter for screening for lipoid disorders: Secondary | ICD-10-CM | POA: Diagnosis not present

## 2022-03-17 DIAGNOSIS — Z1329 Encounter for screening for other suspected endocrine disorder: Secondary | ICD-10-CM | POA: Diagnosis not present

## 2022-03-17 DIAGNOSIS — R7303 Prediabetes: Secondary | ICD-10-CM | POA: Diagnosis not present

## 2022-03-17 DIAGNOSIS — E611 Iron deficiency: Secondary | ICD-10-CM | POA: Diagnosis not present

## 2022-03-20 ENCOUNTER — Other Ambulatory Visit: Payer: Self-pay | Admitting: Family Medicine

## 2022-03-20 DIAGNOSIS — Z1231 Encounter for screening mammogram for malignant neoplasm of breast: Secondary | ICD-10-CM

## 2022-04-24 ENCOUNTER — Ambulatory Visit
Admission: RE | Admit: 2022-04-24 | Discharge: 2022-04-24 | Disposition: A | Discharge status: Home / Self Care | Payer: BC Managed Care – PPO | Source: Ambulatory Visit | Attending: Family Medicine | Admitting: Family Medicine

## 2022-04-24 DIAGNOSIS — Z1231 Encounter for screening mammogram for malignant neoplasm of breast: Secondary | ICD-10-CM

## 2022-04-26 ENCOUNTER — Other Ambulatory Visit: Payer: Self-pay | Admitting: Family Medicine

## 2022-04-26 DIAGNOSIS — R928 Other abnormal and inconclusive findings on diagnostic imaging of breast: Secondary | ICD-10-CM

## 2022-05-05 ENCOUNTER — Ambulatory Visit
Admission: RE | Admit: 2022-05-05 | Discharge: 2022-05-05 | Disposition: A | Discharge status: Home / Self Care | Payer: BC Managed Care – PPO | Source: Ambulatory Visit | Attending: Family Medicine | Admitting: Family Medicine

## 2022-05-05 ENCOUNTER — Other Ambulatory Visit: Payer: Self-pay | Admitting: Family Medicine

## 2022-05-05 DIAGNOSIS — R928 Other abnormal and inconclusive findings on diagnostic imaging of breast: Secondary | ICD-10-CM

## 2022-05-05 DIAGNOSIS — R921 Mammographic calcification found on diagnostic imaging of breast: Secondary | ICD-10-CM

## 2022-05-16 ENCOUNTER — Ambulatory Visit
Admission: RE | Admit: 2022-05-16 | Discharge: 2022-05-16 | Disposition: A | Discharge status: Home / Self Care | Payer: BC Managed Care – PPO | Source: Ambulatory Visit | Attending: Family Medicine | Admitting: Family Medicine

## 2022-05-16 ENCOUNTER — Other Ambulatory Visit: Payer: Self-pay | Admitting: Family Medicine

## 2022-05-16 DIAGNOSIS — R928 Other abnormal and inconclusive findings on diagnostic imaging of breast: Secondary | ICD-10-CM

## 2022-05-16 DIAGNOSIS — N6011 Diffuse cystic mastopathy of right breast: Secondary | ICD-10-CM | POA: Diagnosis not present

## 2022-05-16 DIAGNOSIS — R921 Mammographic calcification found on diagnostic imaging of breast: Secondary | ICD-10-CM | POA: Diagnosis not present

## 2022-06-13 DIAGNOSIS — M1712 Unilateral primary osteoarthritis, left knee: Secondary | ICD-10-CM | POA: Diagnosis not present

## 2022-06-13 DIAGNOSIS — M25562 Pain in left knee: Secondary | ICD-10-CM | POA: Diagnosis not present

## 2022-06-16 DIAGNOSIS — T84093A Other mechanical complication of internal left knee prosthesis, initial encounter: Secondary | ICD-10-CM | POA: Diagnosis not present

## 2022-06-16 DIAGNOSIS — N189 Chronic kidney disease, unspecified: Secondary | ICD-10-CM | POA: Diagnosis not present

## 2022-06-16 DIAGNOSIS — S72452A Displaced supracondylar fracture without intracondylar extension of lower end of left femur, initial encounter for closed fracture: Secondary | ICD-10-CM | POA: Diagnosis not present

## 2022-06-16 DIAGNOSIS — Z86018 Personal history of other benign neoplasm: Secondary | ICD-10-CM | POA: Diagnosis not present

## 2022-06-16 DIAGNOSIS — Z96652 Presence of left artificial knee joint: Secondary | ICD-10-CM | POA: Diagnosis not present

## 2022-06-16 DIAGNOSIS — R9389 Abnormal findings on diagnostic imaging of other specified body structures: Secondary | ICD-10-CM | POA: Diagnosis not present

## 2022-06-16 DIAGNOSIS — M25562 Pain in left knee: Secondary | ICD-10-CM | POA: Diagnosis not present

## 2022-06-16 DIAGNOSIS — M1612 Unilateral primary osteoarthritis, left hip: Secondary | ICD-10-CM | POA: Diagnosis not present

## 2022-06-22 DIAGNOSIS — Z01810 Encounter for preprocedural cardiovascular examination: Secondary | ICD-10-CM | POA: Diagnosis not present

## 2022-06-22 DIAGNOSIS — D510 Vitamin B12 deficiency anemia due to intrinsic factor deficiency: Secondary | ICD-10-CM | POA: Diagnosis not present

## 2022-06-22 DIAGNOSIS — Z96652 Presence of left artificial knee joint: Secondary | ICD-10-CM | POA: Diagnosis not present

## 2022-06-22 DIAGNOSIS — J45909 Unspecified asthma, uncomplicated: Secondary | ICD-10-CM | POA: Diagnosis not present

## 2022-06-22 DIAGNOSIS — Z01812 Encounter for preprocedural laboratory examination: Secondary | ICD-10-CM | POA: Diagnosis not present

## 2022-06-22 DIAGNOSIS — K219 Gastro-esophageal reflux disease without esophagitis: Secondary | ICD-10-CM | POA: Diagnosis not present

## 2022-06-22 DIAGNOSIS — D480 Neoplasm of uncertain behavior of bone and articular cartilage: Secondary | ICD-10-CM | POA: Diagnosis not present

## 2022-06-22 DIAGNOSIS — Z01818 Encounter for other preprocedural examination: Secondary | ICD-10-CM | POA: Diagnosis not present

## 2022-06-22 DIAGNOSIS — T84093D Other mechanical complication of internal left knee prosthesis, subsequent encounter: Secondary | ICD-10-CM | POA: Diagnosis not present

## 2022-06-23 DIAGNOSIS — Z96652 Presence of left artificial knee joint: Secondary | ICD-10-CM | POA: Diagnosis not present

## 2022-06-23 DIAGNOSIS — Z471 Aftercare following joint replacement surgery: Secondary | ICD-10-CM | POA: Diagnosis not present

## 2022-06-23 DIAGNOSIS — T84197A Other mechanical complication of internal fixation device of bone of left lower leg, initial encounter: Secondary | ICD-10-CM | POA: Diagnosis not present

## 2022-06-23 DIAGNOSIS — K219 Gastro-esophageal reflux disease without esophagitis: Secondary | ICD-10-CM | POA: Diagnosis not present

## 2022-06-23 DIAGNOSIS — T84033A Mechanical loosening of internal left knee prosthetic joint, initial encounter: Secondary | ICD-10-CM | POA: Diagnosis not present

## 2022-06-23 DIAGNOSIS — S72402A Unspecified fracture of lower end of left femur, initial encounter for closed fracture: Secondary | ICD-10-CM | POA: Diagnosis not present

## 2022-06-23 DIAGNOSIS — T8489XA Other specified complication of internal orthopedic prosthetic devices, implants and grafts, initial encounter: Secondary | ICD-10-CM | POA: Diagnosis not present

## 2022-06-23 DIAGNOSIS — M1612 Unilateral primary osteoarthritis, left hip: Secondary | ICD-10-CM | POA: Diagnosis not present

## 2022-06-23 DIAGNOSIS — T84093A Other mechanical complication of internal left knee prosthesis, initial encounter: Secondary | ICD-10-CM | POA: Diagnosis not present

## 2022-06-23 DIAGNOSIS — S72452A Displaced supracondylar fracture without intracondylar extension of lower end of left femur, initial encounter for closed fracture: Secondary | ICD-10-CM | POA: Diagnosis not present

## 2022-06-23 DIAGNOSIS — Y792 Prosthetic and other implants, materials and accessory orthopedic devices associated with adverse incidents: Secondary | ICD-10-CM | POA: Diagnosis not present

## 2022-06-23 DIAGNOSIS — Z85830 Personal history of malignant neoplasm of bone: Secondary | ICD-10-CM | POA: Diagnosis not present

## 2022-06-23 DIAGNOSIS — D62 Acute posthemorrhagic anemia: Secondary | ICD-10-CM | POA: Diagnosis not present

## 2022-06-23 DIAGNOSIS — J45909 Unspecified asthma, uncomplicated: Secondary | ICD-10-CM | POA: Diagnosis not present

## 2022-07-04 DIAGNOSIS — M25662 Stiffness of left knee, not elsewhere classified: Secondary | ICD-10-CM | POA: Diagnosis not present

## 2022-07-04 DIAGNOSIS — T84093A Other mechanical complication of internal left knee prosthesis, initial encounter: Secondary | ICD-10-CM | POA: Diagnosis not present

## 2022-07-04 DIAGNOSIS — R2689 Other abnormalities of gait and mobility: Secondary | ICD-10-CM | POA: Diagnosis not present

## 2022-07-04 DIAGNOSIS — M6281 Muscle weakness (generalized): Secondary | ICD-10-CM | POA: Diagnosis not present

## 2022-07-04 DIAGNOSIS — S72452A Displaced supracondylar fracture without intracondylar extension of lower end of left femur, initial encounter for closed fracture: Secondary | ICD-10-CM | POA: Diagnosis not present

## 2022-07-04 DIAGNOSIS — X58XXXA Exposure to other specified factors, initial encounter: Secondary | ICD-10-CM | POA: Diagnosis not present

## 2022-07-06 DIAGNOSIS — S72452A Displaced supracondylar fracture without intracondylar extension of lower end of left femur, initial encounter for closed fracture: Secondary | ICD-10-CM | POA: Diagnosis not present

## 2022-07-06 DIAGNOSIS — R2689 Other abnormalities of gait and mobility: Secondary | ICD-10-CM | POA: Diagnosis not present

## 2022-07-06 DIAGNOSIS — T84093A Other mechanical complication of internal left knee prosthesis, initial encounter: Secondary | ICD-10-CM | POA: Diagnosis not present

## 2022-07-06 DIAGNOSIS — X58XXXA Exposure to other specified factors, initial encounter: Secondary | ICD-10-CM | POA: Diagnosis not present

## 2022-07-06 DIAGNOSIS — M25662 Stiffness of left knee, not elsewhere classified: Secondary | ICD-10-CM | POA: Diagnosis not present

## 2022-07-06 DIAGNOSIS — M6281 Muscle weakness (generalized): Secondary | ICD-10-CM | POA: Diagnosis not present

## 2022-07-11 DIAGNOSIS — S72452A Displaced supracondylar fracture without intracondylar extension of lower end of left femur, initial encounter for closed fracture: Secondary | ICD-10-CM | POA: Diagnosis not present

## 2022-07-11 DIAGNOSIS — X58XXXA Exposure to other specified factors, initial encounter: Secondary | ICD-10-CM | POA: Diagnosis not present

## 2022-07-11 DIAGNOSIS — M6281 Muscle weakness (generalized): Secondary | ICD-10-CM | POA: Diagnosis not present

## 2022-07-11 DIAGNOSIS — R2689 Other abnormalities of gait and mobility: Secondary | ICD-10-CM | POA: Diagnosis not present

## 2022-07-11 DIAGNOSIS — M25662 Stiffness of left knee, not elsewhere classified: Secondary | ICD-10-CM | POA: Diagnosis not present

## 2022-07-11 DIAGNOSIS — T84093A Other mechanical complication of internal left knee prosthesis, initial encounter: Secondary | ICD-10-CM | POA: Diagnosis not present

## 2022-07-11 DIAGNOSIS — M25562 Pain in left knee: Secondary | ICD-10-CM | POA: Diagnosis not present

## 2022-07-13 DIAGNOSIS — R2689 Other abnormalities of gait and mobility: Secondary | ICD-10-CM | POA: Diagnosis not present

## 2022-07-13 DIAGNOSIS — M25662 Stiffness of left knee, not elsewhere classified: Secondary | ICD-10-CM | POA: Diagnosis not present

## 2022-07-13 DIAGNOSIS — M25562 Pain in left knee: Secondary | ICD-10-CM | POA: Diagnosis not present

## 2022-07-13 DIAGNOSIS — X58XXXA Exposure to other specified factors, initial encounter: Secondary | ICD-10-CM | POA: Diagnosis not present

## 2022-07-13 DIAGNOSIS — M6281 Muscle weakness (generalized): Secondary | ICD-10-CM | POA: Diagnosis not present

## 2022-07-13 DIAGNOSIS — S72452A Displaced supracondylar fracture without intracondylar extension of lower end of left femur, initial encounter for closed fracture: Secondary | ICD-10-CM | POA: Diagnosis not present

## 2022-07-13 DIAGNOSIS — T84093A Other mechanical complication of internal left knee prosthesis, initial encounter: Secondary | ICD-10-CM | POA: Diagnosis not present

## 2022-07-14 DIAGNOSIS — M1612 Unilateral primary osteoarthritis, left hip: Secondary | ICD-10-CM | POA: Diagnosis not present

## 2022-07-14 DIAGNOSIS — Z86018 Personal history of other benign neoplasm: Secondary | ICD-10-CM | POA: Diagnosis not present

## 2022-07-18 DIAGNOSIS — T84093A Other mechanical complication of internal left knee prosthesis, initial encounter: Secondary | ICD-10-CM | POA: Diagnosis not present

## 2022-07-18 DIAGNOSIS — R2689 Other abnormalities of gait and mobility: Secondary | ICD-10-CM | POA: Diagnosis not present

## 2022-07-18 DIAGNOSIS — M25562 Pain in left knee: Secondary | ICD-10-CM | POA: Diagnosis not present

## 2022-07-18 DIAGNOSIS — S72452A Displaced supracondylar fracture without intracondylar extension of lower end of left femur, initial encounter for closed fracture: Secondary | ICD-10-CM | POA: Diagnosis not present

## 2022-07-18 DIAGNOSIS — M25662 Stiffness of left knee, not elsewhere classified: Secondary | ICD-10-CM | POA: Diagnosis not present

## 2022-07-18 DIAGNOSIS — X58XXXA Exposure to other specified factors, initial encounter: Secondary | ICD-10-CM | POA: Diagnosis not present

## 2022-07-18 DIAGNOSIS — M6281 Muscle weakness (generalized): Secondary | ICD-10-CM | POA: Diagnosis not present

## 2022-07-20 DIAGNOSIS — M6281 Muscle weakness (generalized): Secondary | ICD-10-CM | POA: Diagnosis not present

## 2022-07-20 DIAGNOSIS — M25662 Stiffness of left knee, not elsewhere classified: Secondary | ICD-10-CM | POA: Diagnosis not present

## 2022-07-20 DIAGNOSIS — T84093A Other mechanical complication of internal left knee prosthesis, initial encounter: Secondary | ICD-10-CM | POA: Diagnosis not present

## 2022-07-20 DIAGNOSIS — M25562 Pain in left knee: Secondary | ICD-10-CM | POA: Diagnosis not present

## 2022-07-20 DIAGNOSIS — R2689 Other abnormalities of gait and mobility: Secondary | ICD-10-CM | POA: Diagnosis not present

## 2022-07-20 DIAGNOSIS — S72452A Displaced supracondylar fracture without intracondylar extension of lower end of left femur, initial encounter for closed fracture: Secondary | ICD-10-CM | POA: Diagnosis not present

## 2022-07-20 DIAGNOSIS — X58XXXA Exposure to other specified factors, initial encounter: Secondary | ICD-10-CM | POA: Diagnosis not present

## 2022-07-24 DIAGNOSIS — M25662 Stiffness of left knee, not elsewhere classified: Secondary | ICD-10-CM | POA: Diagnosis not present

## 2022-07-24 DIAGNOSIS — R2689 Other abnormalities of gait and mobility: Secondary | ICD-10-CM | POA: Diagnosis not present

## 2022-07-24 DIAGNOSIS — X58XXXA Exposure to other specified factors, initial encounter: Secondary | ICD-10-CM | POA: Diagnosis not present

## 2022-07-24 DIAGNOSIS — M25562 Pain in left knee: Secondary | ICD-10-CM | POA: Diagnosis not present

## 2022-07-24 DIAGNOSIS — M6281 Muscle weakness (generalized): Secondary | ICD-10-CM | POA: Diagnosis not present

## 2022-07-24 DIAGNOSIS — S72452A Displaced supracondylar fracture without intracondylar extension of lower end of left femur, initial encounter for closed fracture: Secondary | ICD-10-CM | POA: Diagnosis not present

## 2022-07-24 DIAGNOSIS — T84093A Other mechanical complication of internal left knee prosthesis, initial encounter: Secondary | ICD-10-CM | POA: Diagnosis not present

## 2022-07-26 DIAGNOSIS — M25662 Stiffness of left knee, not elsewhere classified: Secondary | ICD-10-CM | POA: Diagnosis not present

## 2022-07-26 DIAGNOSIS — T84093A Other mechanical complication of internal left knee prosthesis, initial encounter: Secondary | ICD-10-CM | POA: Diagnosis not present

## 2022-07-26 DIAGNOSIS — X58XXXA Exposure to other specified factors, initial encounter: Secondary | ICD-10-CM | POA: Diagnosis not present

## 2022-07-26 DIAGNOSIS — M25562 Pain in left knee: Secondary | ICD-10-CM | POA: Diagnosis not present

## 2022-07-26 DIAGNOSIS — M6281 Muscle weakness (generalized): Secondary | ICD-10-CM | POA: Diagnosis not present

## 2022-07-26 DIAGNOSIS — R2689 Other abnormalities of gait and mobility: Secondary | ICD-10-CM | POA: Diagnosis not present

## 2022-07-26 DIAGNOSIS — S72452A Displaced supracondylar fracture without intracondylar extension of lower end of left femur, initial encounter for closed fracture: Secondary | ICD-10-CM | POA: Diagnosis not present

## 2022-08-01 DIAGNOSIS — T84093A Other mechanical complication of internal left knee prosthesis, initial encounter: Secondary | ICD-10-CM | POA: Diagnosis not present

## 2022-08-01 DIAGNOSIS — S72452A Displaced supracondylar fracture without intracondylar extension of lower end of left femur, initial encounter for closed fracture: Secondary | ICD-10-CM | POA: Diagnosis not present

## 2022-08-01 DIAGNOSIS — M6281 Muscle weakness (generalized): Secondary | ICD-10-CM | POA: Diagnosis not present

## 2022-08-01 DIAGNOSIS — R2689 Other abnormalities of gait and mobility: Secondary | ICD-10-CM | POA: Diagnosis not present

## 2022-08-01 DIAGNOSIS — M25662 Stiffness of left knee, not elsewhere classified: Secondary | ICD-10-CM | POA: Diagnosis not present

## 2022-08-01 DIAGNOSIS — M25562 Pain in left knee: Secondary | ICD-10-CM | POA: Diagnosis not present

## 2022-08-01 DIAGNOSIS — X58XXXA Exposure to other specified factors, initial encounter: Secondary | ICD-10-CM | POA: Diagnosis not present

## 2022-08-03 DIAGNOSIS — M25562 Pain in left knee: Secondary | ICD-10-CM | POA: Diagnosis not present

## 2022-08-03 DIAGNOSIS — R2689 Other abnormalities of gait and mobility: Secondary | ICD-10-CM | POA: Diagnosis not present

## 2022-08-03 DIAGNOSIS — M6281 Muscle weakness (generalized): Secondary | ICD-10-CM | POA: Diagnosis not present

## 2022-08-03 DIAGNOSIS — M25662 Stiffness of left knee, not elsewhere classified: Secondary | ICD-10-CM | POA: Diagnosis not present

## 2022-08-03 DIAGNOSIS — S72452A Displaced supracondylar fracture without intracondylar extension of lower end of left femur, initial encounter for closed fracture: Secondary | ICD-10-CM | POA: Diagnosis not present

## 2022-08-03 DIAGNOSIS — T84093A Other mechanical complication of internal left knee prosthesis, initial encounter: Secondary | ICD-10-CM | POA: Diagnosis not present

## 2022-08-03 DIAGNOSIS — X58XXXA Exposure to other specified factors, initial encounter: Secondary | ICD-10-CM | POA: Diagnosis not present

## 2022-08-09 DIAGNOSIS — S72452A Displaced supracondylar fracture without intracondylar extension of lower end of left femur, initial encounter for closed fracture: Secondary | ICD-10-CM | POA: Diagnosis not present

## 2022-08-09 DIAGNOSIS — Y838 Other surgical procedures as the cause of abnormal reaction of the patient, or of later complication, without mention of misadventure at the time of the procedure: Secondary | ICD-10-CM | POA: Diagnosis not present

## 2022-08-09 DIAGNOSIS — M25562 Pain in left knee: Secondary | ICD-10-CM | POA: Diagnosis not present

## 2022-08-09 DIAGNOSIS — X58XXXA Exposure to other specified factors, initial encounter: Secondary | ICD-10-CM | POA: Diagnosis not present

## 2022-08-09 DIAGNOSIS — M25662 Stiffness of left knee, not elsewhere classified: Secondary | ICD-10-CM | POA: Diagnosis not present

## 2022-08-09 DIAGNOSIS — T84093A Other mechanical complication of internal left knee prosthesis, initial encounter: Secondary | ICD-10-CM | POA: Diagnosis not present

## 2022-08-09 DIAGNOSIS — M6281 Muscle weakness (generalized): Secondary | ICD-10-CM | POA: Diagnosis not present

## 2022-08-09 DIAGNOSIS — R2689 Other abnormalities of gait and mobility: Secondary | ICD-10-CM | POA: Diagnosis not present

## 2022-08-14 DIAGNOSIS — Z9889 Other specified postprocedural states: Secondary | ICD-10-CM | POA: Diagnosis not present

## 2022-08-14 DIAGNOSIS — Z09 Encounter for follow-up examination after completed treatment for conditions other than malignant neoplasm: Secondary | ICD-10-CM | POA: Diagnosis not present

## 2022-08-15 DIAGNOSIS — S72452A Displaced supracondylar fracture without intracondylar extension of lower end of left femur, initial encounter for closed fracture: Secondary | ICD-10-CM | POA: Diagnosis not present

## 2022-08-15 DIAGNOSIS — M25562 Pain in left knee: Secondary | ICD-10-CM | POA: Diagnosis not present

## 2022-08-15 DIAGNOSIS — Y838 Other surgical procedures as the cause of abnormal reaction of the patient, or of later complication, without mention of misadventure at the time of the procedure: Secondary | ICD-10-CM | POA: Diagnosis not present

## 2022-08-15 DIAGNOSIS — R2689 Other abnormalities of gait and mobility: Secondary | ICD-10-CM | POA: Diagnosis not present

## 2022-08-15 DIAGNOSIS — X58XXXA Exposure to other specified factors, initial encounter: Secondary | ICD-10-CM | POA: Diagnosis not present

## 2022-08-15 DIAGNOSIS — M6281 Muscle weakness (generalized): Secondary | ICD-10-CM | POA: Diagnosis not present

## 2022-08-15 DIAGNOSIS — T84093A Other mechanical complication of internal left knee prosthesis, initial encounter: Secondary | ICD-10-CM | POA: Diagnosis not present

## 2022-08-15 DIAGNOSIS — M25662 Stiffness of left knee, not elsewhere classified: Secondary | ICD-10-CM | POA: Diagnosis not present

## 2022-08-22 DIAGNOSIS — S72452A Displaced supracondylar fracture without intracondylar extension of lower end of left femur, initial encounter for closed fracture: Secondary | ICD-10-CM | POA: Diagnosis not present

## 2022-08-22 DIAGNOSIS — R2689 Other abnormalities of gait and mobility: Secondary | ICD-10-CM | POA: Diagnosis not present

## 2022-08-22 DIAGNOSIS — M25562 Pain in left knee: Secondary | ICD-10-CM | POA: Diagnosis not present

## 2022-08-22 DIAGNOSIS — T84093A Other mechanical complication of internal left knee prosthesis, initial encounter: Secondary | ICD-10-CM | POA: Diagnosis not present

## 2022-08-22 DIAGNOSIS — X58XXXA Exposure to other specified factors, initial encounter: Secondary | ICD-10-CM | POA: Diagnosis not present

## 2022-08-22 DIAGNOSIS — M25662 Stiffness of left knee, not elsewhere classified: Secondary | ICD-10-CM | POA: Diagnosis not present

## 2022-08-22 DIAGNOSIS — M6281 Muscle weakness (generalized): Secondary | ICD-10-CM | POA: Diagnosis not present

## 2022-08-22 DIAGNOSIS — Y838 Other surgical procedures as the cause of abnormal reaction of the patient, or of later complication, without mention of misadventure at the time of the procedure: Secondary | ICD-10-CM | POA: Diagnosis not present

## 2022-08-30 DIAGNOSIS — S72452A Displaced supracondylar fracture without intracondylar extension of lower end of left femur, initial encounter for closed fracture: Secondary | ICD-10-CM | POA: Diagnosis not present

## 2022-08-30 DIAGNOSIS — M25562 Pain in left knee: Secondary | ICD-10-CM | POA: Diagnosis not present

## 2022-08-30 DIAGNOSIS — Y838 Other surgical procedures as the cause of abnormal reaction of the patient, or of later complication, without mention of misadventure at the time of the procedure: Secondary | ICD-10-CM | POA: Diagnosis not present

## 2022-08-30 DIAGNOSIS — M6281 Muscle weakness (generalized): Secondary | ICD-10-CM | POA: Diagnosis not present

## 2022-08-30 DIAGNOSIS — T84093A Other mechanical complication of internal left knee prosthesis, initial encounter: Secondary | ICD-10-CM | POA: Diagnosis not present

## 2022-08-30 DIAGNOSIS — X58XXXA Exposure to other specified factors, initial encounter: Secondary | ICD-10-CM | POA: Diagnosis not present

## 2022-08-30 DIAGNOSIS — R2689 Other abnormalities of gait and mobility: Secondary | ICD-10-CM | POA: Diagnosis not present

## 2022-08-30 DIAGNOSIS — M25662 Stiffness of left knee, not elsewhere classified: Secondary | ICD-10-CM | POA: Diagnosis not present

## 2022-10-07 IMAGING — MG MM DIGITAL SCREENING BILAT W/ TOMO AND CAD
8 series · 9 of 24 positions shown · non-contrast
Comparison: Previous exam(s).

CLINICAL DATA: Screening.

EXAM:
DIGITAL SCREENING BILATERAL MAMMOGRAM WITH TOMOSYNTHESIS AND CAD
TECHNIQUE: Bilateral screening digital craniocaudal and mediolateral oblique
mammograms were obtained. Bilateral screening digital breast
tomosynthesis was performed. The images were evaluated with
computer-aided detection.

[L CC synth-2D]
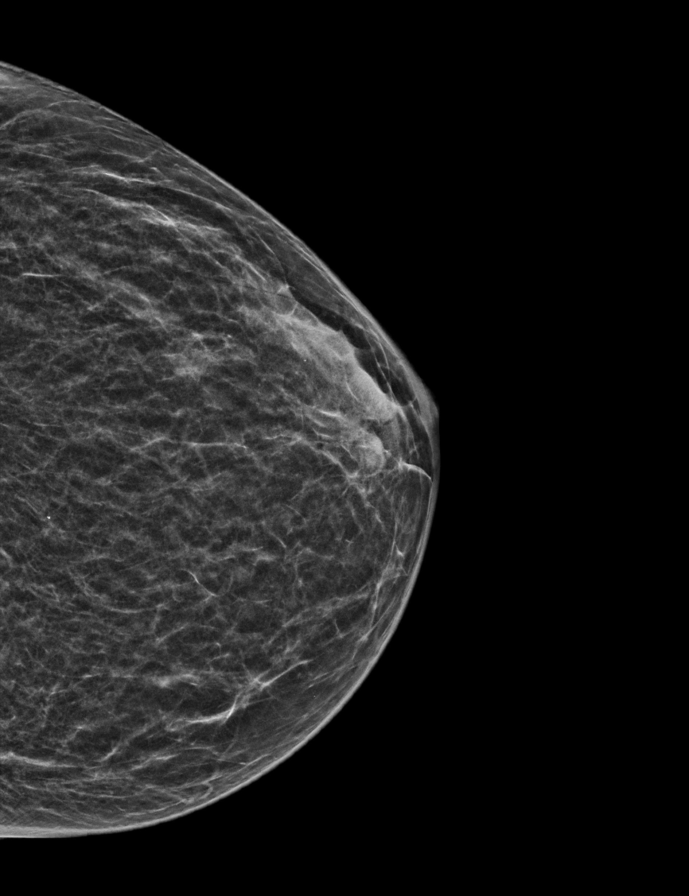

[R MLO synth-2D]
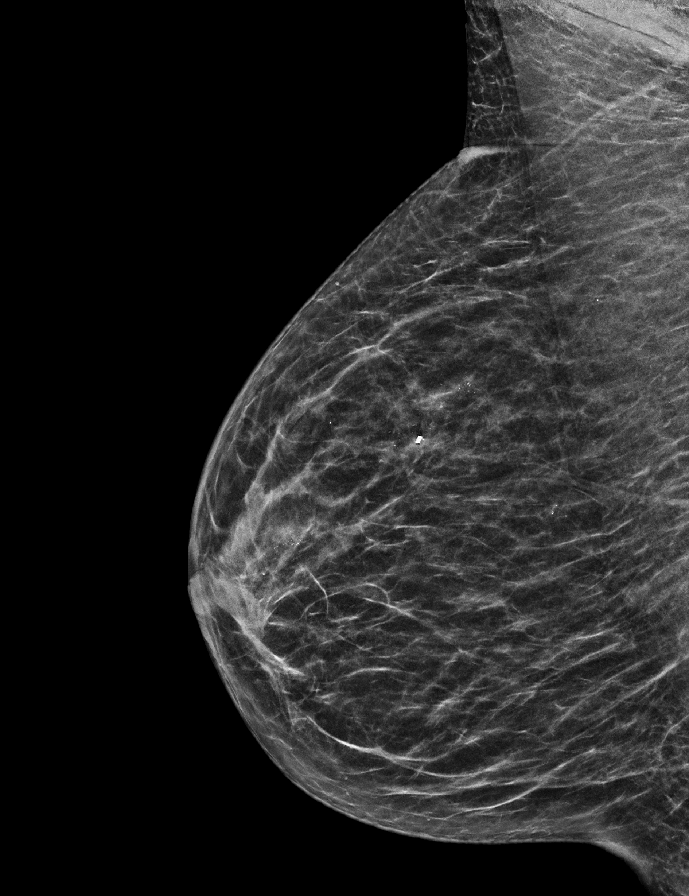

[R CC synth-2D]
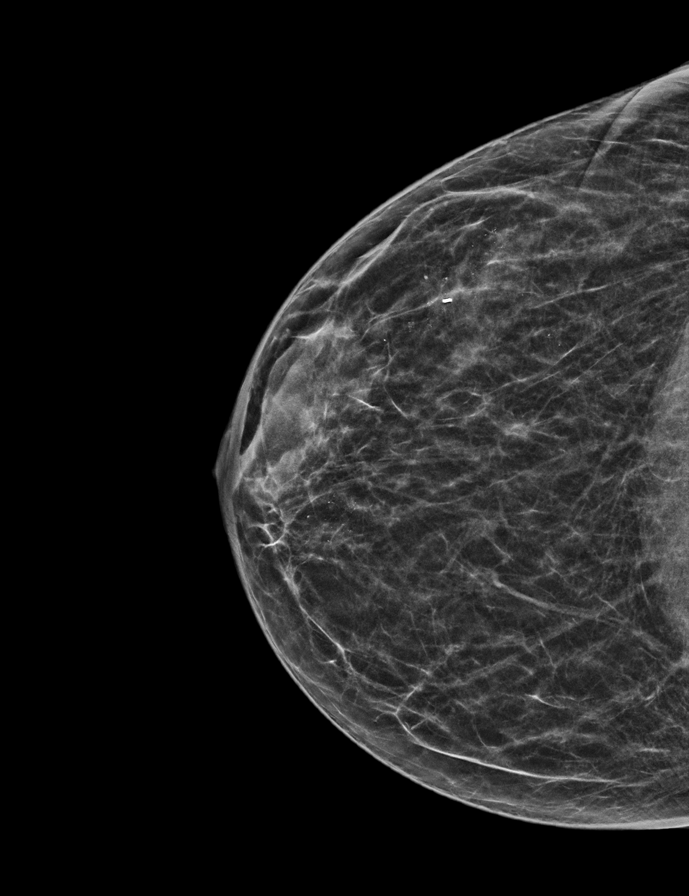

[L MLO synth-2D]
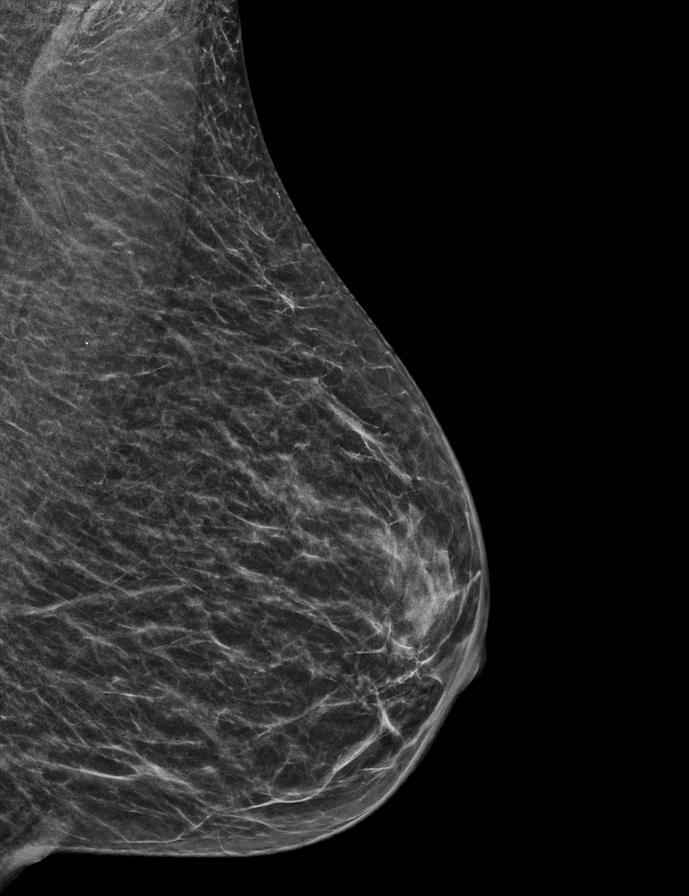

[L MLO tomo · 2 of 42 frames shown]
[frame 14/42]
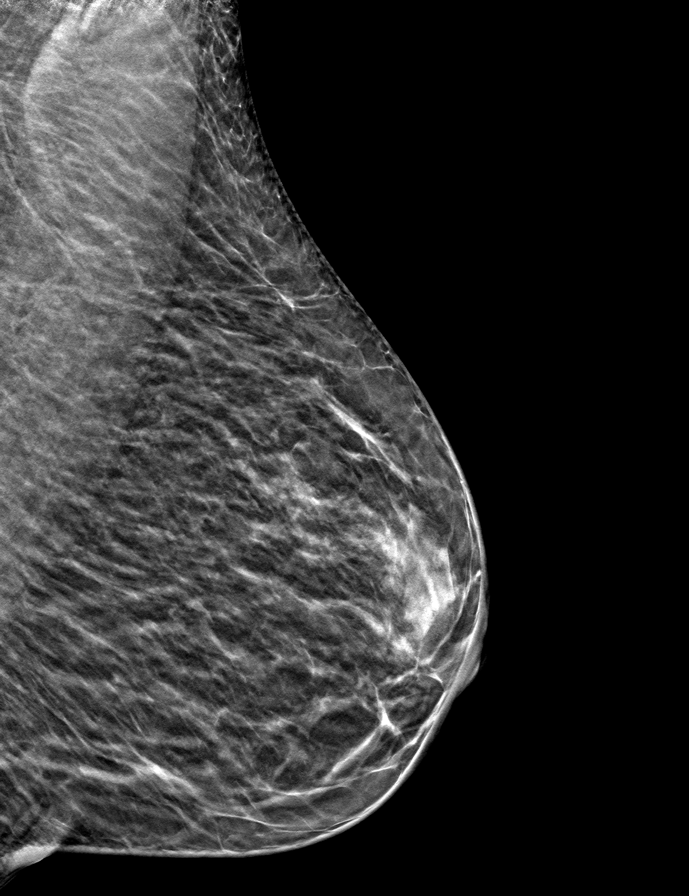
[frame 21/42]
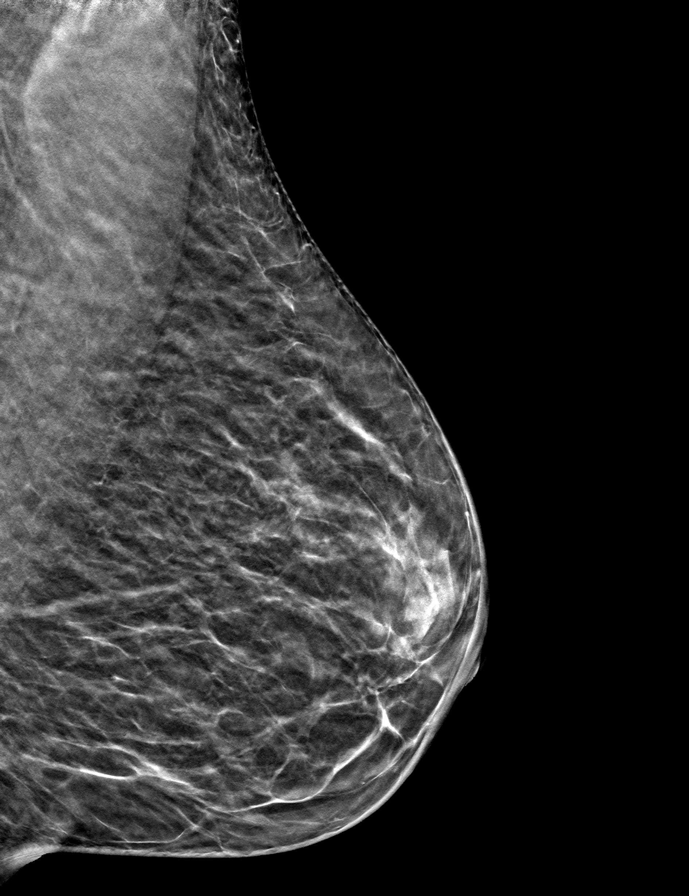

[R CC tomo · tomo slice 23/44.0]
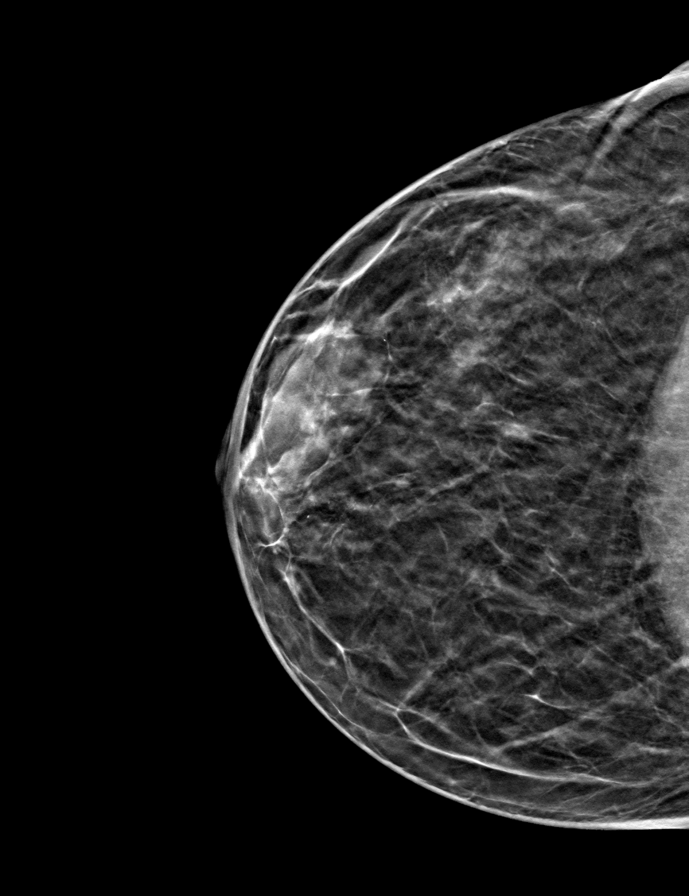

[R MLO tomo · tomo slice 23/46.0]
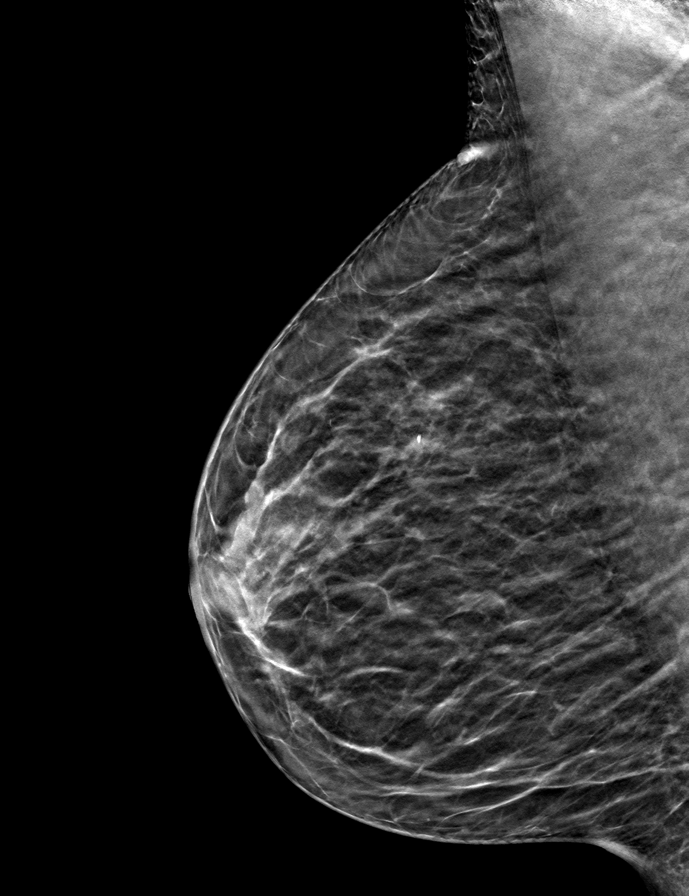

[L CC tomo · tomo slice 19/38.0]
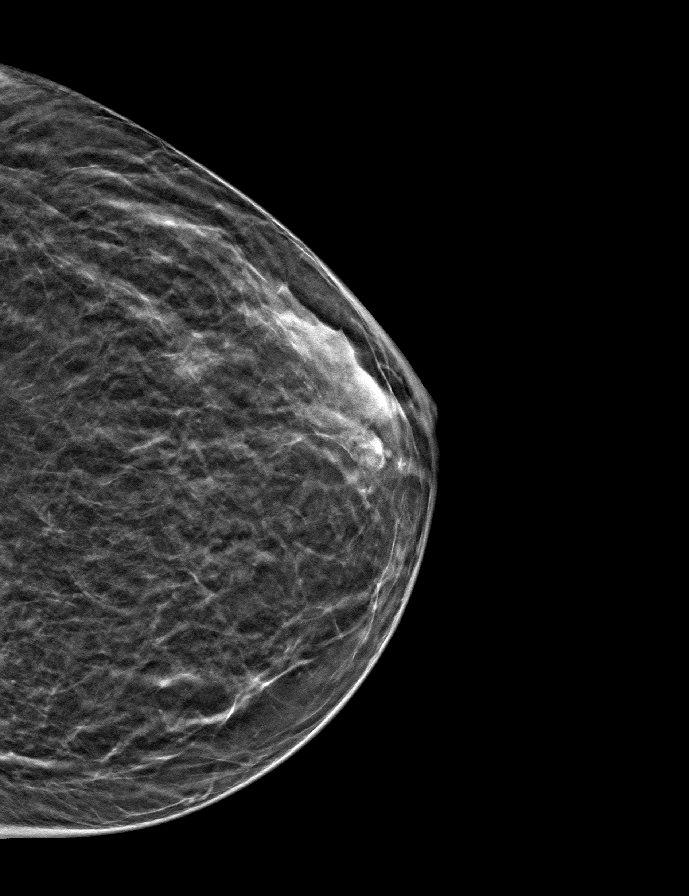

[9 of 24 positions shown; findings below may reference images not displayed]

ACR Breast Density Category b: There are scattered areas of
fibroglandular density.
FINDINGS: There are no findings suspicious for malignancy.
IMPRESSION: No mammographic evidence of malignancy. A result letter of this
screening mammogram will be mailed directly to the patient.

RECOMMENDATION:
Screening mammogram in one year. (Code:51-O-LD2)

BI-RADS CATEGORY  1: Negative.

## 2022-11-13 DIAGNOSIS — Z86018 Personal history of other benign neoplasm: Secondary | ICD-10-CM | POA: Diagnosis not present

## 2022-11-13 DIAGNOSIS — Z471 Aftercare following joint replacement surgery: Secondary | ICD-10-CM | POA: Diagnosis not present

## 2022-11-13 DIAGNOSIS — Z96652 Presence of left artificial knee joint: Secondary | ICD-10-CM | POA: Diagnosis not present

## 2022-11-13 DIAGNOSIS — T84093A Other mechanical complication of internal left knee prosthesis, initial encounter: Secondary | ICD-10-CM | POA: Diagnosis not present

## 2022-11-13 DIAGNOSIS — Z09 Encounter for follow-up examination after completed treatment for conditions other than malignant neoplasm: Secondary | ICD-10-CM | POA: Diagnosis not present

## 2022-12-12 DIAGNOSIS — Z09 Encounter for follow-up examination after completed treatment for conditions other than malignant neoplasm: Secondary | ICD-10-CM | POA: Diagnosis not present

## 2022-12-12 DIAGNOSIS — Z86018 Personal history of other benign neoplasm: Secondary | ICD-10-CM | POA: Diagnosis not present

## 2022-12-12 DIAGNOSIS — T84093A Other mechanical complication of internal left knee prosthesis, initial encounter: Secondary | ICD-10-CM | POA: Diagnosis not present

## 2022-12-12 DIAGNOSIS — Z96652 Presence of left artificial knee joint: Secondary | ICD-10-CM | POA: Diagnosis not present

## 2023-01-05 DIAGNOSIS — Z13220 Encounter for screening for lipoid disorders: Secondary | ICD-10-CM | POA: Diagnosis not present

## 2023-01-05 DIAGNOSIS — R7303 Prediabetes: Secondary | ICD-10-CM | POA: Diagnosis not present

## 2023-01-11 DIAGNOSIS — M76821 Posterior tibial tendinitis, right leg: Secondary | ICD-10-CM | POA: Diagnosis not present

## 2023-01-11 DIAGNOSIS — M76822 Posterior tibial tendinitis, left leg: Secondary | ICD-10-CM | POA: Diagnosis not present

## 2023-01-11 DIAGNOSIS — M216X1 Other acquired deformities of right foot: Secondary | ICD-10-CM | POA: Diagnosis not present

## 2023-01-15 DIAGNOSIS — J45909 Unspecified asthma, uncomplicated: Secondary | ICD-10-CM | POA: Diagnosis not present

## 2023-01-15 DIAGNOSIS — R7303 Prediabetes: Secondary | ICD-10-CM | POA: Diagnosis not present

## 2023-01-15 DIAGNOSIS — Z6826 Body mass index (BMI) 26.0-26.9, adult: Secondary | ICD-10-CM | POA: Diagnosis not present

## 2023-04-13 DIAGNOSIS — M76822 Posterior tibial tendinitis, left leg: Secondary | ICD-10-CM | POA: Diagnosis not present

## 2023-04-13 DIAGNOSIS — M76821 Posterior tibial tendinitis, right leg: Secondary | ICD-10-CM | POA: Diagnosis not present

## 2023-09-18 ENCOUNTER — Other Ambulatory Visit (HOSPITAL_BASED_OUTPATIENT_CLINIC_OR_DEPARTMENT_OTHER): Payer: Self-pay | Admitting: Physician Assistant

## 2023-09-18 DIAGNOSIS — Z6826 Body mass index (BMI) 26.0-26.9, adult: Secondary | ICD-10-CM | POA: Diagnosis not present

## 2023-09-18 DIAGNOSIS — Z23 Encounter for immunization: Secondary | ICD-10-CM | POA: Diagnosis not present

## 2023-09-18 DIAGNOSIS — Z1231 Encounter for screening mammogram for malignant neoplasm of breast: Secondary | ICD-10-CM

## 2023-09-18 DIAGNOSIS — Z0000 Encounter for general adult medical examination without abnormal findings: Secondary | ICD-10-CM | POA: Diagnosis not present

## 2023-09-24 ENCOUNTER — Encounter (HOSPITAL_BASED_OUTPATIENT_CLINIC_OR_DEPARTMENT_OTHER): Payer: Self-pay | Admitting: Radiology

## 2023-09-24 ENCOUNTER — Ambulatory Visit (HOSPITAL_BASED_OUTPATIENT_CLINIC_OR_DEPARTMENT_OTHER)
Admission: RE | Admit: 2023-09-24 | Discharge: 2023-09-24 | Disposition: A | Discharge status: Home / Self Care | Payer: BC Managed Care – PPO | Source: Ambulatory Visit | Attending: Physician Assistant | Admitting: Physician Assistant

## 2023-09-24 DIAGNOSIS — Z1231 Encounter for screening mammogram for malignant neoplasm of breast: Secondary | ICD-10-CM

## 2024-04-01 DIAGNOSIS — Z6828 Body mass index (BMI) 28.0-28.9, adult: Secondary | ICD-10-CM | POA: Diagnosis not present

## 2024-04-01 DIAGNOSIS — J45909 Unspecified asthma, uncomplicated: Secondary | ICD-10-CM | POA: Diagnosis not present

## 2024-04-01 DIAGNOSIS — H6990 Unspecified Eustachian tube disorder, unspecified ear: Secondary | ICD-10-CM | POA: Diagnosis not present

## 2024-04-01 DIAGNOSIS — E785 Hyperlipidemia, unspecified: Secondary | ICD-10-CM | POA: Diagnosis not present

## 2024-05-06 DIAGNOSIS — H6993 Unspecified Eustachian tube disorder, bilateral: Secondary | ICD-10-CM | POA: Diagnosis not present

## 2024-05-06 DIAGNOSIS — H9192 Unspecified hearing loss, left ear: Secondary | ICD-10-CM | POA: Diagnosis not present

## 2024-05-06 DIAGNOSIS — H9312 Tinnitus, left ear: Secondary | ICD-10-CM | POA: Diagnosis not present
